# Patient Record
Sex: Male | Born: 1960 | Race: White | Hispanic: No | Marital: Married | Smoking: Former smoker
Health system: Southern US, Community
[De-identification: ages and names within clinical notes are randomized; demographics above are authoritative.]

## PROBLEM LIST (undated history)

## (undated) DIAGNOSIS — E7800 Pure hypercholesterolemia, unspecified: Secondary | ICD-10-CM

## (undated) DIAGNOSIS — I10 Essential (primary) hypertension: Secondary | ICD-10-CM

## (undated) DIAGNOSIS — E785 Hyperlipidemia, unspecified: Secondary | ICD-10-CM

## (undated) DIAGNOSIS — E119 Type 2 diabetes mellitus without complications: Secondary | ICD-10-CM

## (undated) HISTORY — DX: Type 2 diabetes mellitus without complications: E11.9

## (undated) HISTORY — DX: Pure hypercholesterolemia, unspecified: E78.00

## (undated) HISTORY — DX: Hyperlipidemia, unspecified: E78.5

## (undated) HISTORY — DX: Essential (primary) hypertension: I10

---

## 2008-08-22 HISTORY — PX: COLONOSCOPY: SHX174

## 2016-09-16 DIAGNOSIS — J0100 Acute maxillary sinusitis, unspecified: Secondary | ICD-10-CM | POA: Diagnosis not present

## 2016-11-07 DIAGNOSIS — E785 Hyperlipidemia, unspecified: Secondary | ICD-10-CM | POA: Diagnosis not present

## 2016-11-07 DIAGNOSIS — E119 Type 2 diabetes mellitus without complications: Secondary | ICD-10-CM | POA: Diagnosis not present

## 2017-02-07 DIAGNOSIS — I10 Essential (primary) hypertension: Secondary | ICD-10-CM | POA: Diagnosis not present

## 2017-02-07 DIAGNOSIS — E785 Hyperlipidemia, unspecified: Secondary | ICD-10-CM | POA: Diagnosis not present

## 2017-02-07 DIAGNOSIS — E119 Type 2 diabetes mellitus without complications: Secondary | ICD-10-CM | POA: Diagnosis not present

## 2017-05-16 DIAGNOSIS — I10 Essential (primary) hypertension: Secondary | ICD-10-CM | POA: Diagnosis not present

## 2017-05-16 DIAGNOSIS — E1169 Type 2 diabetes mellitus with other specified complication: Secondary | ICD-10-CM | POA: Diagnosis not present

## 2017-05-16 DIAGNOSIS — E782 Mixed hyperlipidemia: Secondary | ICD-10-CM | POA: Diagnosis not present

## 2017-08-28 DIAGNOSIS — Z1211 Encounter for screening for malignant neoplasm of colon: Secondary | ICD-10-CM | POA: Diagnosis not present

## 2017-08-28 DIAGNOSIS — Z0000 Encounter for general adult medical examination without abnormal findings: Secondary | ICD-10-CM | POA: Diagnosis not present

## 2017-08-28 DIAGNOSIS — Z125 Encounter for screening for malignant neoplasm of prostate: Secondary | ICD-10-CM | POA: Diagnosis not present

## 2017-12-26 DIAGNOSIS — E785 Hyperlipidemia, unspecified: Secondary | ICD-10-CM | POA: Diagnosis not present

## 2017-12-26 DIAGNOSIS — I10 Essential (primary) hypertension: Secondary | ICD-10-CM | POA: Diagnosis not present

## 2017-12-26 DIAGNOSIS — E119 Type 2 diabetes mellitus without complications: Secondary | ICD-10-CM | POA: Diagnosis not present

## 2017-12-29 DIAGNOSIS — I10 Essential (primary) hypertension: Secondary | ICD-10-CM | POA: Diagnosis not present

## 2017-12-29 DIAGNOSIS — E119 Type 2 diabetes mellitus without complications: Secondary | ICD-10-CM | POA: Diagnosis not present

## 2017-12-29 DIAGNOSIS — E785 Hyperlipidemia, unspecified: Secondary | ICD-10-CM | POA: Diagnosis not present

## 2018-04-13 DIAGNOSIS — E785 Hyperlipidemia, unspecified: Secondary | ICD-10-CM | POA: Diagnosis not present

## 2018-04-13 DIAGNOSIS — I10 Essential (primary) hypertension: Secondary | ICD-10-CM | POA: Diagnosis not present

## 2018-04-13 DIAGNOSIS — E119 Type 2 diabetes mellitus without complications: Secondary | ICD-10-CM | POA: Diagnosis not present

## 2018-04-16 DIAGNOSIS — E785 Hyperlipidemia, unspecified: Secondary | ICD-10-CM | POA: Diagnosis not present

## 2018-04-16 DIAGNOSIS — E119 Type 2 diabetes mellitus without complications: Secondary | ICD-10-CM | POA: Diagnosis not present

## 2018-04-16 DIAGNOSIS — I10 Essential (primary) hypertension: Secondary | ICD-10-CM | POA: Diagnosis not present

## 2018-07-18 DIAGNOSIS — I10 Essential (primary) hypertension: Secondary | ICD-10-CM | POA: Diagnosis not present

## 2018-07-18 DIAGNOSIS — E119 Type 2 diabetes mellitus without complications: Secondary | ICD-10-CM | POA: Diagnosis not present

## 2018-07-18 DIAGNOSIS — E785 Hyperlipidemia, unspecified: Secondary | ICD-10-CM | POA: Diagnosis not present

## 2018-07-23 DIAGNOSIS — M79671 Pain in right foot: Secondary | ICD-10-CM | POA: Diagnosis not present

## 2018-07-23 DIAGNOSIS — E1165 Type 2 diabetes mellitus with hyperglycemia: Secondary | ICD-10-CM | POA: Diagnosis not present

## 2018-07-23 DIAGNOSIS — M79661 Pain in right lower leg: Secondary | ICD-10-CM | POA: Diagnosis not present

## 2018-07-31 DIAGNOSIS — R2241 Localized swelling, mass and lump, right lower limb: Secondary | ICD-10-CM | POA: Diagnosis not present

## 2018-07-31 DIAGNOSIS — M79671 Pain in right foot: Secondary | ICD-10-CM | POA: Diagnosis not present

## 2018-07-31 DIAGNOSIS — M79661 Pain in right lower leg: Secondary | ICD-10-CM | POA: Diagnosis not present

## 2018-07-31 DIAGNOSIS — M19071 Primary osteoarthritis, right ankle and foot: Secondary | ICD-10-CM | POA: Diagnosis not present

## 2018-10-22 DIAGNOSIS — I10 Essential (primary) hypertension: Secondary | ICD-10-CM | POA: Diagnosis not present

## 2018-10-22 DIAGNOSIS — E785 Hyperlipidemia, unspecified: Secondary | ICD-10-CM | POA: Diagnosis not present

## 2018-10-22 DIAGNOSIS — E119 Type 2 diabetes mellitus without complications: Secondary | ICD-10-CM | POA: Diagnosis not present

## 2020-01-08 ENCOUNTER — Telehealth: Payer: Self-pay | Admitting: Cardiology

## 2020-01-08 ENCOUNTER — Other Ambulatory Visit: Payer: Self-pay

## 2020-01-08 NOTE — Telephone Encounter (Signed)
New Message    Pts wife is returning call to [facility]    Please call

## 2020-01-09 ENCOUNTER — Encounter: Payer: Self-pay | Admitting: Cardiology

## 2020-01-09 ENCOUNTER — Ambulatory Visit: Payer: Commercial Managed Care - PPO | Admitting: Cardiology

## 2020-01-09 ENCOUNTER — Other Ambulatory Visit: Payer: Self-pay

## 2020-01-09 DIAGNOSIS — E088 Diabetes mellitus due to underlying condition with unspecified complications: Secondary | ICD-10-CM | POA: Insufficient documentation

## 2020-01-09 DIAGNOSIS — I4729 Other ventricular tachycardia: Secondary | ICD-10-CM

## 2020-01-09 DIAGNOSIS — I10 Essential (primary) hypertension: Secondary | ICD-10-CM

## 2020-01-09 DIAGNOSIS — E782 Mixed hyperlipidemia: Secondary | ICD-10-CM

## 2020-01-09 DIAGNOSIS — I472 Ventricular tachycardia: Secondary | ICD-10-CM | POA: Diagnosis not present

## 2020-01-09 DIAGNOSIS — F17210 Nicotine dependence, cigarettes, uncomplicated: Secondary | ICD-10-CM

## 2020-01-09 HISTORY — DX: Other ventricular tachycardia: I47.29

## 2020-01-09 HISTORY — DX: Ventricular tachycardia: I47.2

## 2020-01-09 HISTORY — DX: Diabetes mellitus due to underlying condition with unspecified complications: E08.8

## 2020-01-09 HISTORY — DX: Nicotine dependence, cigarettes, uncomplicated: F17.210

## 2020-01-09 HISTORY — DX: Essential (primary) hypertension: I10

## 2020-01-09 HISTORY — DX: Mixed hyperlipidemia: E78.2

## 2020-01-09 MED ORDER — METOPROLOL SUCCINATE ER 50 MG PO TB24
50.0000 mg | ORAL_TABLET | Freq: Every day | ORAL | 0 refills | Status: DC
Start: 2020-01-09 — End: 2020-09-22

## 2020-01-09 MED ORDER — METOPROLOL SUCCINATE ER 50 MG PO TB24
50.0000 mg | ORAL_TABLET | Freq: Every day | ORAL | 3 refills | Status: DC
Start: 2020-01-09 — End: 2020-09-22

## 2020-01-09 NOTE — Patient Instructions (Signed)
Medication Instructions:  Your physician has recommended you make the following change in your medication:   Start Toprol XL 50 mg daily.  *If you need a refill on your cardiac medications before your next appointment, please call your pharmacy*   Lab Work: You had a BMET today in the office.  If you have labs (blood work) drawn today and your tests are completely normal, you will receive your results only by: Marland Kitchen MyChart Message (if you have MyChart) OR . A paper copy in the mail If you have any lab test that is abnormal or we need to change your treatment, we will call you to review the results.   Testing/Procedures: Your physician has requested that you have an echocardiogram. Echocardiography is a painless test that uses sound waves to create images of your heart. It provides your doctor with information about the size and shape of your heart and how well your heart's chambers and valves are working. This procedure takes approximately one hour. There are no restrictions for this procedure.    Your cardiac CT will be scheduled at:   [facility] [address] [place], [place] [address] [phone]   [facility], please arrive at the [facility] main entrance of [facility] 30 minutes prior to test start time. Proceed to the [facility] Radiology Department (first floor) to check-in and test prep.   Please follow these instructions carefully (unless otherwise directed):  Hold all erectile dysfunction medications at least 3 days (72 hrs) prior to test.  On the Night Before the Test: . Be sure to Drink plenty of water. . Do not consume any caffeinated/decaffeinated beverages or chocolate 12 hours prior to your test. . Do not take any antihistamines 12 hours prior to your test. . If you take Metformin do not take 24 hours prior to test.  On the Day of the Test: . Drink plenty of water. Do not drink any water within one hour of the test. . Do  not eat any food 4 hours prior to the test. . You may take your regular medications prior to the test.  . Take metoprolol (Lopressor) two hours prior to test.       After the Test: . Drink plenty of water. . After receiving IV contrast, you may experience a mild flushed feeling. This is normal. . On occasion, you may experience a mild rash up to 24 hours after the test. This is not dangerous. If this occurs, you can take Benadryl 25 mg and increase your fluid intake. . If you experience trouble breathing, this can be serious. If it is severe call 911 IMMEDIATELY. If it is mild, please call our office. . If you take any of these medications: Glipizide/Metformin, Avandament, Glucavance, please do not take 48 hours after completing test unless otherwise instructed.   Once we have confirmed authorization from your insurance company, we will call you to set up a date and time for your test.   For non-scheduling related questions, please contact the cardiac imaging nurse navigator should you have any questions/concerns: Marchia Bond, Cardiac Imaging Nurse Navigator Burley Saver, Interim Cardiac Imaging Nurse [facility] Direct Office Dial: [phone]   For scheduling needs, including cancellations and rescheduling, please call [phone].      Follow-Up: At [facility], you and your health needs are our priority.  As part of our continuing mission to provide you with exceptional heart care, we have created  designated Provider Care Teams.  These Care Teams include your primary Cardiologist (physician) and Advanced Practice Providers (APPs -  Physician Assistants and Nurse Practitioners) who all work together to provide you with the care you need, when you need it.  We recommend signing up for the patient portal called "MyChart".  Sign up information is provided on this After Visit Summary.  MyChart is used to connect with patients for Virtual Visits  (Telemedicine).  Patients are able to view lab/test results, encounter notes, upcoming appointments, etc.  Non-urgent messages can be sent to your provider as well.   To learn more about what you can do with MyChart, go to [email or web address].    Your next appointment:   6 month(s)  The format for your next appointment:   In Person  Provider:   Jyl Heinz, MD   Other Instructions  Echocardiogram An echocardiogram is a procedure that uses painless sound waves (ultrasound) to produce an image of the heart. Images from an echocardiogram can provide important information about:  Signs of coronary artery disease (CAD).  Aneurysm detection. An aneurysm is a weak or damaged part of an artery wall that bulges out from the normal force of blood pumping through the body.  Heart size and shape. Changes in the size or shape of the heart can be associated with certain conditions, including heart failure, aneurysm, and CAD.  Heart muscle function.  Heart valve function.  Signs of a past heart attack.  Fluid buildup around the heart.  Thickening of the heart muscle.  A tumor or infectious growth around the heart valves. Tell a health care provider about:  Any allergies you have.  All medicines you are taking, including vitamins, herbs, eye drops, creams, and over-the-counter medicines.  Any blood disorders you have.  Any surgeries you have had.  Any medical conditions you have.  Whether you are pregnant or may be pregnant. What are the risks? Generally, this is a safe procedure. However, problems may occur, including:  Allergic reaction to dye (contrast) that may be used during the procedure. What happens before the procedure? No specific preparation is needed. You may eat and drink normally. What happens during the procedure?   An IV tube may be inserted into one of your veins.  You may receive contrast through this tube. A contrast is an injection that improves  the quality of the pictures from your heart.  A gel will be applied to your chest.  A wand-like tool (transducer) will be moved over your chest. The gel will help to transmit the sound waves from the transducer.  The sound waves will harmlessly bounce off of your heart to allow the heart images to be captured in real-time motion. The images will be recorded on a computer. The procedure may vary among health care providers and hospitals. What happens after the procedure?  You may return to your normal, everyday life, including diet, activities, and medicines, unless your health care provider tells you not to do that. Summary  An echocardiogram is a procedure that uses painless sound waves (ultrasound) to produce an image of the heart.  Images from an echocardiogram can provide important information about the size and shape of your heart, heart muscle function, heart valve function, and fluid buildup around your heart.  You do not need to do anything to prepare before this procedure. You may eat and drink normally.  After the echocardiogram is completed, you may return to your normal, everyday life, unless your health  care provider tells you not to do that. This information is not intended to replace advice given to you by your health care provider. Make sure you discuss any questions you have with your health care provider. Document Revised: 11/29/2018 Document Reviewed: 09/10/2016 Elsevier Patient Education  [place].

## 2020-01-09 NOTE — Addendum Note (Signed)
Addended by: Eleonore Chiquito on: 01/09/2020 03:39 PM   Modules accepted: Orders

## 2020-01-09 NOTE — Progress Notes (Signed)
Cardiology Office Note:    Date:  01/09/2020   ID:  Brian Salas, DOB November 06, 1960, MRN [id number]  PCP:  Brian Severin, NP  Cardiologist:  Brian Brothers, MD   Referring MD: Brian Severin, NP    ASSESSMENT:    1. Nonsustained ventricular tachycardia (HCC)   2. Essential hypertension   3. Cigarette smoker   4. Diabetes mellitus due to underlying condition with unspecified complications (HCC)   5. Mixed dyslipidemia    PLAN:    In order of problems listed above:  1. Coronary artery disease: Coronary calcification was seen on the CT scan.  Secondary prevention stressed with him.  Importance of compliance with diet medication stressed and he vocalized understanding.  In view of nonsustained ventricular tachycardia we will do a CT scan with FFR.  He is agreeable for this.  I discussed coronary angiography and left heart catheterization and he is not keen on it he would prefer to go with the noninvasive route at this time and I respect his wishes. 2. Nonsustained ventricular tachycardia: As mentioned above.  I discussed diagnosis with him at extensive length.  Lab work is fine.  He will be initiated on beta-blocker Toprol-XL 50 mg daily beginning today. 3. Diabetes mellitus and mixed dyslipidemia: Diet was emphasized.  This is followed by his primary care physician. 4. Cigarette smoker: He quit a few months ago and I discussed smoking cessation with him at extensive length and he promises to never go back to smoking. 5. Patient will be seen in follow-up appointment in 1 month or earlier if the patient has any concerns.  He knows to go to the nearest emergency room for any concerning symptoms.    Medication Adjustments/Labs and Tests Ordered: Current medicines are reviewed at length with the patient today.  Concerns regarding medicines are outlined above.  No orders of the defined types were placed in this encounter.  No orders of the defined types were placed in this  encounter.    History of Present Illness:    Brian Salas is a 59 y.o. male who is being seen today for the evaluation of nonsustained ventricular tachycardia at the request of York, Brian F, NP.  Patient has past medical history of essential hypertension dyslipidemia diabetes mellitus and cigarette smoking.  He mentions to me that he takes care of activities of daily living.  He occasionally had a fluttering sensation in his heart many months ago and monitoring revealed nonsustained ventricular tachycardia and have a copy of that report.  He has never had dizzy spells or syncope.  At the time of my evaluation is alert awake oriented and in no distress.  He tells me he works full-time at [employer] and has no issues of chest pain or any such symptoms with this activity.  Past Medical History:  Diagnosis Date  . Diabetes (HCC)   . High cholesterol   . Hyperlipidemia   . Hypertension     Past Surgical History:  Procedure Laterality Date  . COLONOSCOPY  2010    Current Medications: Current Meds  Medication Sig  . aspirin EC 81 MG tablet Take 81 mg by mouth daily.  Marland Kitchen atorvastatin (LIPITOR) 20 MG tablet Take 20 mg by mouth daily.  Marland Kitchen lisinopril-hydrochlorothiazide (ZESTORETIC) 20-12.5 MG tablet Take 1 tablet by mouth daily.  . metFORMIN (GLUCOPHAGE) 1000 MG tablet Take 1,000 mg by mouth daily.     Allergies:   Morphine and related and Prednisone   Social History  Socioeconomic History  . Marital status: Married    Spouse name: Not on file  . Number of children: Not on file  . Years of education: Not on file  . Highest education level: Not on file  Occupational History  . Not on file  Tobacco Use  . Smoking status: Former Smoker    Types: Cigarettes  . Smokeless tobacco: Never Used  Substance and Sexual Activity  . Alcohol use: Not on file  . Drug use: Not on file  . Sexual activity: Not on file  Other Topics Concern  . Not on file  Social History Narrative  .  Not on file   Social Determinants of Health   Financial Resource Strain:   . Difficulty of Paying Living Expenses:   Food Insecurity:   . Worried About Charity fundraiser in the Last Year:   . Arboriculturist in the Last Year:   Transportation Needs:   . Film/video editor (Medical):   Marland Kitchen Lack of Transportation (Non-Medical):   Physical Activity:   . Days of Exercise per Week:   . Minutes of Exercise per Session:   Stress:   . Feeling of Stress :   Social Connections:   . Frequency of Communication with Friends and Family:   . Frequency of Social Gatherings with Friends and Family:   . Attends Religious Services:   . Active Member of Clubs or Organizations:   . Attends Archivist Meetings:   Marland Kitchen Marital Status:      Family History: The patient's family history includes Diabetes in his father and mother; Heart disease in his brother and father; High Cholesterol in his father; Hypertension in his father and mother.  ROS:   Please see the history of present illness.    All other systems reviewed and are negative.  EKGs/Labs/Other Studies Reviewed:    The following studies were reviewed today: EKG reveals sinus rhythm nonspecific ST-T changes.  Event monitoring has revealed nonsustained ventricular tachycardia.   Recent Labs: No results found for requested labs within last 8760 hours.  Recent Lipid Panel No results found for: CHOL, TRIG, HDL, CHOLHDL, VLDL, LDLCALC, LDLDIRECT  Physical Exam:    VS:  BP 138/80   Pulse 96   Ht 6' (1.829 m)   Wt 203 lb (92.1 kg)   SpO2 92%   BMI 27.53 kg/m     Wt Readings from Last 3 Encounters:  01/09/20 203 lb (92.1 kg)     GEN: Patient is in no acute distress HEENT: Normal NECK: No JVD; No carotid bruits LYMPHATICS: No lymphadenopathy CARDIAC: S1 S2 regular, 2/6 systolic murmur at the apex. RESPIRATORY:  Clear to auscultation without rales, wheezing or rhonchi  ABDOMEN: Soft, non-tender,  non-distended MUSCULOSKELETAL:  No edema; No deformity  SKIN: Warm and dry NEUROLOGIC:  Alert and oriented x 3 PSYCHIATRIC:  Normal affect    Signed, Jenean Lindau, MD  01/09/2020 3:07 PM    [facility]

## 2020-01-10 ENCOUNTER — Telehealth: Payer: Self-pay

## 2020-01-10 ENCOUNTER — Telehealth (HOSPITAL_COMMUNITY): Payer: Self-pay | Admitting: *Deleted

## 2020-01-10 LAB — BASIC METABOLIC PANEL
BUN/Creatinine Ratio: 12 (ref 9–20)
BUN: 14 mg/dL (ref 6–24)
CO2: 21 mmol/L (ref 20–29)
Calcium: 10.1 mg/dL (ref 8.7–10.2)
Chloride: 102 mmol/L (ref 96–106)
Creatinine, Ser: 1.15 mg/dL (ref 0.76–1.27)
GFR calc Af Amer: 80 mL/min/{1.73_m2} (ref >59)
GFR calc non Af Amer: 69 mL/min/{1.73_m2} (ref >59)
Glucose: 121 mg/dL — ABNORMAL HIGH (ref 65–99)
Potassium: 4.1 mmol/L (ref 3.5–5.2)
Sodium: 140 mmol/L (ref 134–144)

## 2020-01-10 NOTE — Telephone Encounter (Signed)
-----   Message from Rajan R Revankar, MD sent at 01/10/2020  7:52 AM EDT ----- The results of the study is unremarkable. Please inform patient. I will discuss in detail at next appointment. Cc  primary care/referring physician Rajan R Revankar, MD 01/10/2020 7:52 AM  

## 2020-01-10 NOTE — Telephone Encounter (Signed)
Spoke with patients wife regarding results and recommendation. ° °She verbalizes understanding and is agreeable to plan of care. Advised patient to call back with any issues or concerns.  °

## 2020-01-10 NOTE — Telephone Encounter (Signed)
Reaching out to patient to offer assistance regarding upcoming cardiac imaging study; pt verbalizes understanding of appt date/time, parking situation and where to check in, pre-test NPO status and medications ordered, and verified current allergies; name and call back number provided for further questions should they arise  Roxsana Riding Tai RN Navigator Cardiac Imaging [facility] [phone] office [phone] cell 

## 2020-01-13 ENCOUNTER — Ambulatory Visit (HOSPITAL_COMMUNITY)
Admission: RE | Admit: 2020-01-13 | Discharge: 2020-01-13 | Disposition: A | Discharge status: Home / Self Care | Payer: Commercial Managed Care - PPO | Source: Ambulatory Visit | Attending: Cardiology | Admitting: Cardiology

## 2020-01-13 ENCOUNTER — Other Ambulatory Visit: Payer: Self-pay

## 2020-01-13 DIAGNOSIS — I472 Ventricular tachycardia: Secondary | ICD-10-CM | POA: Insufficient documentation

## 2020-01-13 DIAGNOSIS — I2510 Atherosclerotic heart disease of native coronary artery without angina pectoris: Secondary | ICD-10-CM | POA: Diagnosis not present

## 2020-01-13 IMAGING — CT CT HEART MORP W/ CTA COR W/ SCORE W/ CA W/CM &/OR W/O CM
4 of 7 series · 8 of 20 positions shown, 9 images · non-contrast
Comparison: None.
COMPARISON: None.

Addendum:
EXAM:
OVER-READ INTERPRETATION  CT CHEST

The following report is an over-read performed by radiologist Dr.
Zoran Murphy [REDACTED] on 01/13/2020. This
over-read does not include interpretation of cardiac or coronary
anatomy or pathology. The coronary calcium score/coronary CTA
interpretation by the cardiologist is attached.
CLINICAL DATA: Non-sustained VT
Cardiac/Coronary CTA
TECHNIQUE: The patient was scanned on a Phillips Force scanner. A 100 kV
prospective scan was triggered in the descending thoracic aorta at
111 HU's. Axial non-contrast 3 mm slices were carried out through
the heart. The data set was analyzed on a dedicated work station and
scored using the Agatson method. Gantry rotation speed was 250 msecs
and collimation was .6 mm. No beta blockade and 0.8 mg of sl NTG was
given. The 3D data set was reconstructed in 5% intervals of the
35-75 % of the R-R cycle. Diastolic phases were analyzed on a
dedicated work station using MPR, MIP and VRT modes. The patient
received 80 cc of contrast.

[Series 6: best diast 69 % · axial · 0.39mm/px · z∈[-222,-187]mm · 2 of 263 slices shown]
[im 88/263  vessel]
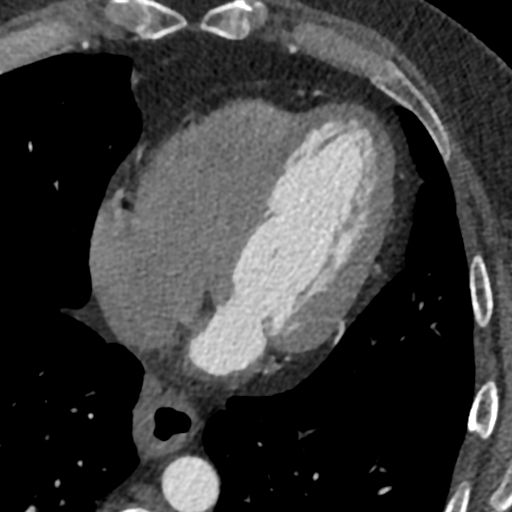
[im 175/263  vessel]
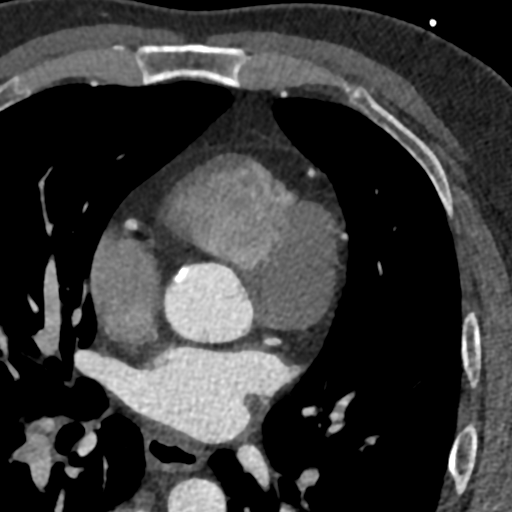

[Series 7: best syst · axial · 0.39mm/px · z∈[-222,-187]mm · 2 of 263 slices shown, 3 images]
[im 88/263  vessel]
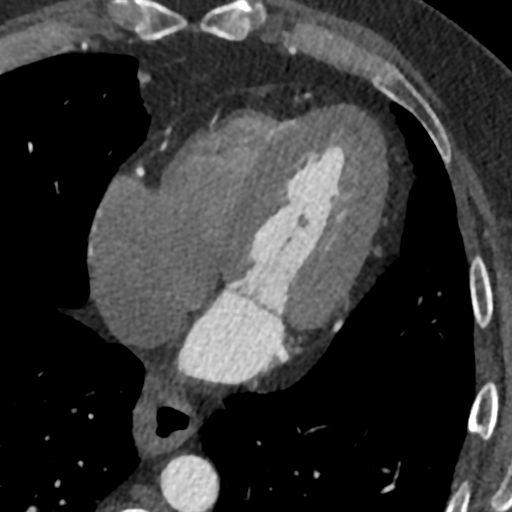
[im 88/263  lung]
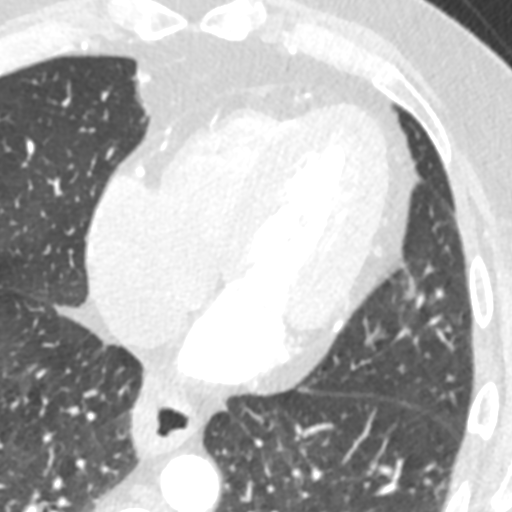
[im 175/263  vessel]
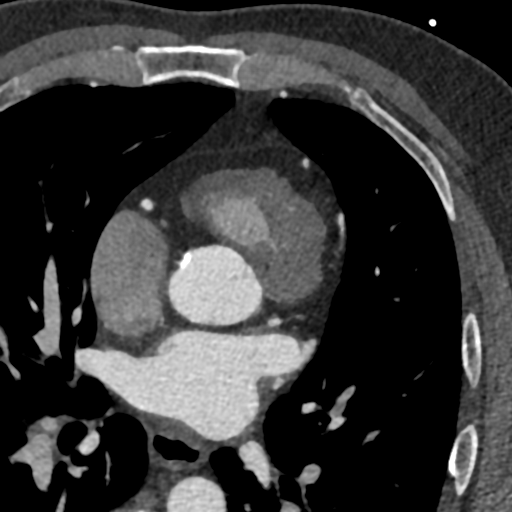

[Series 8: ts diast sharp 69 % · axial · 0.39mm/px · z∈[-222,-187]mm · 2 of 263 slices shown]
[im 88/263  lung]
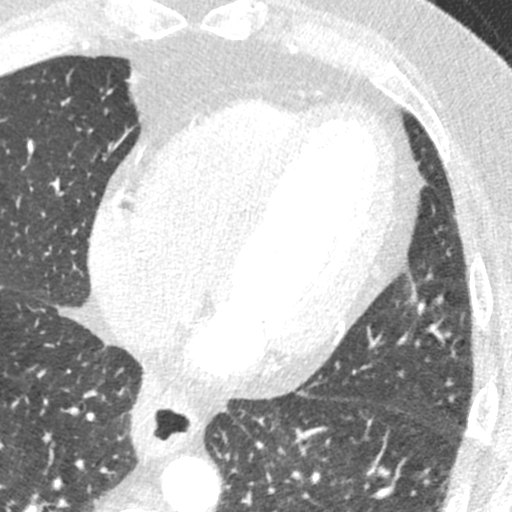
[im 175/263  lung]
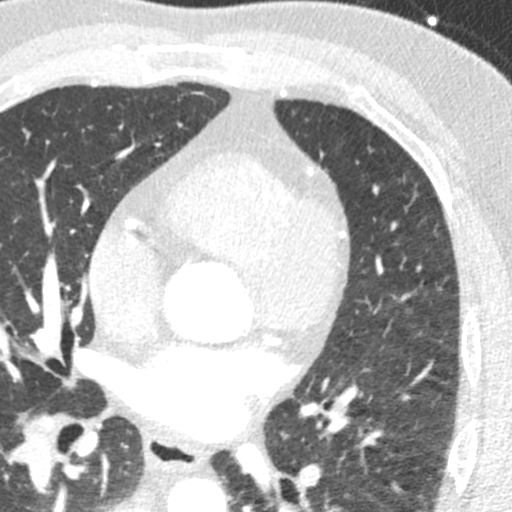

[Series 9: ts syst sharp · axial · 0.39mm/px · z∈[-222,-187]mm · 2 of 263 slices shown]
[im 88/263  lung]
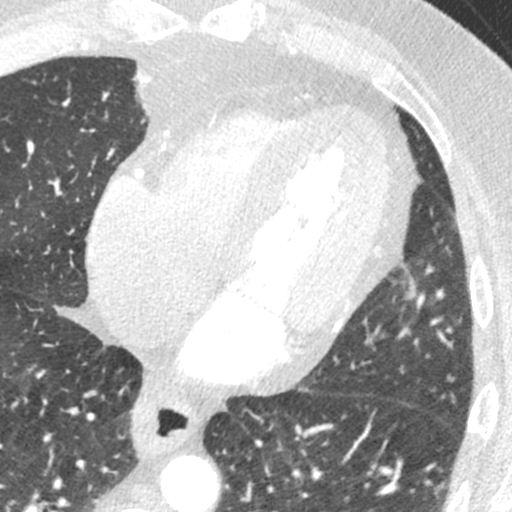
[im 175/263  lung]
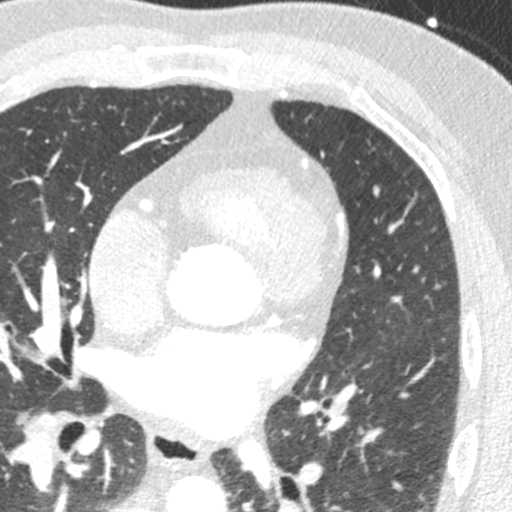

[8 of 20 positions shown; findings below may reference images not displayed]

FINDINGS: Aortic atherosclerosis. Small hiatal hernia. Within the visualized
portions of the thorax there are no suspicious appearing pulmonary
nodules or masses, there is no acute consolidative airspace disease,
no pleural effusions, no pneumothorax and no lymphadenopathy.
Visualized portions of the upper abdomen are unremarkable. There are
no aggressive appearing lytic or blastic lesions noted in the
visualized portions of the skeleton.
IMPRESSION: 1.  Aortic Atherosclerosis (A3O1N-TIY.Y).
2. Small hiatal hernia.
FINDINGS: Image quality: excellent.

Noise artifact is: Limited.

Coronary Arteries:  Normal coronary origin.  Right dominance.

Left main: The left main is a large caliber vessel with a normal
take off from the left coronary cusp that bifurcates to form a left
anterior descending artery and a left circumflex artery. There is
minimal calcified plaque (<25%).

Left anterior descending artery: The proximal LAD contains at least
moderate calcified plaque (50-69%). There is heavy circumferential
calcified plaque making lesion assessment difficult. The mid and
distal LAD segments are patent. The first diagonal branch is large
with minimal calcified plaque (<25%).

Left circumflex artery: The LCX is non-dominant. The proximal LCX
contains minimal calcified plaque (<25%). The mid and distal LCX
segments are patent. The LCX gives off 1 patent obtuse marginal
branch.

Right coronary artery: The RCA is dominant with normal take off from
the right coronary cusp. The ostial RCA contains minimal calcified
plaque (<25%). The mid RCA contains minimal calcified plaque (<25%).
The RCA terminates as a patent PDA and right posterolateral branch
without evidence of plaque or stenosis.

Right Atrium: Right atrial size is within normal limits.

Right Ventricle: The right ventricular cavity is within normal
limits.

Left Atrium: Left atrial size is normal in size with no left atrial
appendage filling defect. A PFO is present.

Left Ventricle: The ventricular cavity size is within normal limits.
There are no stigmata of prior infarction. There is no abnormal
filling defect.

Pulmonary arteries: Normal in size without proximal filling defect.

Pulmonary veins: Normal pulmonary venous drainage.

Pericardium: Normal thickness with no significant effusion or
calcium present.

Cardiac valves: The aortic valve is trileaflet without significant
calcification. The mitral valve is normal structure without
significant calcification.

Aorta: Normal caliber with no significant disease. Mild aortic root
calcifications.

Extra-cardiac findings: See attached radiology report for
non-cardiac structures.
IMPRESSION: 1. Coronary calcium score of 818. This was 96th percentile for age
and sex matched control.

2. Normal coronary origin with right dominance.

3. Concerns for moderate calcified plaque (50-69%) in the proximal
LAD. Heavy blooming artifact noted.

4. Minimal calcified plaque (<25%) in the LCX/RCA.

5. A small PFO is present.

RECOMMENDATIONS:
1. Moderate stenosis (50-69%) in the proximal LAD. Consider
symptom-guided anti-ischemic pharmacotherapy as well as risk factor
modification per guideline directed care. Additional analysis with
CT FFR will be submitted.

*** End of Addendum ***
EXAM:
OVER-READ INTERPRETATION  CT CHEST

The following report is an over-read performed by radiologist Dr.
Zoran Murphy [REDACTED] on 01/13/2020. This
over-read does not include interpretation of cardiac or coronary
anatomy or pathology. The coronary calcium score/coronary CTA
interpretation by the cardiologist is attached.
FINDINGS: Aortic atherosclerosis. Small hiatal hernia. Within the visualized
portions of the thorax there are no suspicious appearing pulmonary
nodules or masses, there is no acute consolidative airspace disease,
no pleural effusions, no pneumothorax and no lymphadenopathy.
Visualized portions of the upper abdomen are unremarkable. There are
no aggressive appearing lytic or blastic lesions noted in the
visualized portions of the skeleton.
IMPRESSION: 1.  Aortic Atherosclerosis (A3O1N-TIY.Y).
2. Small hiatal hernia.

## 2020-01-13 MED ORDER — NITROGLYCERIN 0.4 MG SL SUBL
SUBLINGUAL_TABLET | SUBLINGUAL | Status: AC
  Administered 2020-01-13: 0.8 mg via SUBLINGUAL
  Filled 2020-01-13: qty 2

## 2020-01-13 MED ORDER — IOHEXOL 350 MG/ML SOLN
100.0000 mL | Freq: Once | INTRAVENOUS | Status: AC | PRN
  Administered 2020-01-13: 80 mL via INTRAVENOUS

## 2020-01-13 MED ORDER — NITROGLYCERIN 0.4 MG SL SUBL: 0.8000 mg | SUBLINGUAL_TABLET | Freq: Once | SUBLINGUAL | Status: AC

## 2020-01-14 DIAGNOSIS — I472 Ventricular tachycardia: Secondary | ICD-10-CM | POA: Diagnosis not present

## 2020-01-14 DIAGNOSIS — I2510 Atherosclerotic heart disease of native coronary artery without angina pectoris: Secondary | ICD-10-CM | POA: Diagnosis not present

## 2020-01-15 ENCOUNTER — Telehealth: Payer: Self-pay | Admitting: Cardiology

## 2020-01-15 NOTE — Telephone Encounter (Signed)
Connected call to [facility] in reference to CT results

## 2020-01-16 ENCOUNTER — Other Ambulatory Visit: Payer: Self-pay

## 2020-01-16 ENCOUNTER — Ambulatory Visit (INDEPENDENT_AMBULATORY_CARE_PROVIDER_SITE_OTHER): Payer: Commercial Managed Care - PPO | Admitting: Cardiology

## 2020-01-16 ENCOUNTER — Encounter: Payer: Self-pay | Admitting: Cardiology

## 2020-01-16 VITALS — BP 138/76 | HR 82 | Ht 72.0 in | Wt 204.0 lb

## 2020-01-16 DIAGNOSIS — F17210 Nicotine dependence, cigarettes, uncomplicated: Secondary | ICD-10-CM | POA: Diagnosis not present

## 2020-01-16 DIAGNOSIS — E782 Mixed hyperlipidemia: Secondary | ICD-10-CM

## 2020-01-16 DIAGNOSIS — I472 Ventricular tachycardia: Secondary | ICD-10-CM

## 2020-01-16 DIAGNOSIS — I10 Essential (primary) hypertension: Secondary | ICD-10-CM

## 2020-01-16 DIAGNOSIS — I4729 Other ventricular tachycardia: Secondary | ICD-10-CM

## 2020-01-16 DIAGNOSIS — I2510 Atherosclerotic heart disease of native coronary artery without angina pectoris: Secondary | ICD-10-CM | POA: Insufficient documentation

## 2020-01-16 DIAGNOSIS — E088 Diabetes mellitus due to underlying condition with unspecified complications: Secondary | ICD-10-CM

## 2020-01-16 HISTORY — DX: Atherosclerotic heart disease of native coronary artery without angina pectoris: I25.10

## 2020-01-16 NOTE — Progress Notes (Signed)
Cardiology Office Note:    Date:  01/16/2020   ID:  Brian Salas, DOB 03-Dec-1960, MRN [id number]  PCP:  Brian Riches, NP  Cardiologist:  Brian Lindau, MD   Referring MD: Brian Riches, NP    ASSESSMENT:    1. Essential hypertension   2. Coronary artery disease involving native coronary artery of native heart without angina pectoris   3. Nonsustained ventricular tachycardia ([place])   4. Cigarette smoker   5. Diabetes mellitus due to underlying condition with unspecified complications ([place])   6. Mixed dyslipidemia    PLAN:    In order of problems listed above:  1. Coronary artery disease: Secondary prevention stressed with the patient.  Importance of compliance with diet and medication stressed he vocalized understanding.  CT coronary angiography reports were discussed with the patient at extensive length and questions were answered to his satisfaction.  He takes a coated aspirin on a daily basis. 2. Essential hypertension: Blood pressure stable 3. Mixed dyslipidemia: I will get him back for the next few days for blood work and probably change his statin to a medication such as atorvastatin or rosuvastatin. 4. I counseled him about smoking again and he plans to put 5. Patient will be seen in follow-up appointment in 6 months or earlier if the patient has any concerns 6.    Medication Adjustments/Labs and Tests Ordered: Current medicines are reviewed at length with the patient today.  Concerns regarding medicines are outlined above.  No orders of the defined types were placed in this encounter.  No orders of the defined types were placed in this encounter.    Chief Complaint  Patient presents with  . Follow-up     History of Present Illness:    Brian Salas is a 59 y.o. male.  Patient has past medical history of essential hypertension dyslipidemia and is a smoker.  CT coronary angiography revealed significant calcium score and significant amount of nonobstructive  coronary artery disease.  Therefore he was called for evaluation.  He denies any chest pain orthopnea or PND.  He works actively a full-time job.  At the time of my evaluation, the patient is alert awake oriented and in no distress.  Past Medical History:  Diagnosis Date  . Diabetes ([place])   . High cholesterol   . Hyperlipidemia   . Hypertension     Past Surgical History:  Procedure Laterality Date  . COLONOSCOPY  2010    Current Medications: Current Meds  Medication Sig  . aspirin EC 81 MG tablet Take 81 mg by mouth daily.  Marland Kitchen atorvastatin (LIPITOR) 20 MG tablet Take 20 mg by mouth daily.  Marland Kitchen lisinopril-hydrochlorothiazide (ZESTORETIC) 20-12.5 MG tablet Take 1 tablet by mouth daily.  . metFORMIN (GLUCOPHAGE) 1000 MG tablet Take 1,000 mg by mouth daily.  . metoprolol succinate (TOPROL-XL) 50 MG 24 hr tablet Take 1 tablet (50 mg total) by mouth daily. Take with or immediately following a meal.  . metoprolol succinate (TOPROL-XL) 50 MG 24 hr tablet Take 1 tablet (50 mg total) by mouth daily. Take with or immediately following a meal.     Allergies:   Morphine and related and Prednisone   Social History   Socioeconomic History  . Marital status: Married    Spouse name: Not on file  . Number of children: Not on file  . Years of education: Not on file  . Highest education level: Not on file  Occupational History  . Not on file  Tobacco Use  . Smoking status: Former Smoker    Types: Cigarettes  . Smokeless tobacco: Never Used  Substance and Sexual Activity  . Alcohol use: Not on file  . Drug use: Not on file  . Sexual activity: Not on file  Other Topics Concern  . Not on file  Social History Narrative  . Not on file   Social Determinants of Health   Financial Resource Strain:   . Difficulty of Paying Living Expenses:   Food Insecurity:   . Worried About Programme researcher, broadcasting/film/video in the Last Year:   . Barista in the Last Year:   Transportation Needs:   . Automotive engineer (Medical):   Marland Kitchen Lack of Transportation (Non-Medical):   Physical Activity:   . Days of Exercise per Week:   . Minutes of Exercise per Session:   Stress:   . Feeling of Stress :   Social Connections:   . Frequency of Communication with Friends and Family:   . Frequency of Social Gatherings with Friends and Family:   . Attends Religious Services:   . Active Member of Clubs or Organizations:   . Attends Banker Meetings:   Marland Kitchen Marital Status:      Family History: The patient's family history includes Diabetes in his father and mother; Heart disease in his brother and father; High Cholesterol in his father; Hypertension in his father and mother.  ROS:   Please see the history of present illness.    All other systems reviewed and are negative.  EKGs/Labs/Other Studies Reviewed:    The following studies were reviewed today: Coronary artery disease.  CT scan report was discussed with the patient at length.  CT FFR ANALYSIS  CLINICAL DATA:  Chest pain  FINDINGS: FFRct analysis was performed on the original cardiac CT angiogram dataset. Diagrammatic representation of the FFRct analysis is provided in a separate PDF document in PACS. This dictation was created using the PDF document and an interactive 3D model of the results. 3D model is not available in the EMR/PACS. Normal FFR range is >0.80.  1. Left Main: 0.99; no significant stenosis.  2. Proximal LAD: 0.96; no significant stenosis. 3. Mid LAD: 0.88; no significant stenosis. 4. LCX: 0.94; no significant stenosis. 5. RCA: 0.91; no significant stenosis.  IMPRESSION: 1.  CT FFR analysis didn't show any significant stenosis.  Brian Odor, MD   Electronically Signed   By: Brian Salas   On: 01/14/2020 17:56   Recent Labs: 01/09/2020: BUN 14; Creatinine, Ser 1.15; Potassium 4.1; Sodium 140  Recent Lipid Panel No results found for: CHOL, TRIG, HDL, CHOLHDL, VLDL, LDLCALC,  LDLDIRECT  Physical Exam:    VS:  BP 138/76   Pulse 82   Ht 6' (1.829 m)   Wt 204 lb (92.5 kg)   SpO2 90%   BMI 27.67 kg/m     Wt Readings from Last 3 Encounters:  01/16/20 204 lb (92.5 kg)  01/09/20 203 lb (92.1 kg)     GEN: Patient is in no acute distress HEENT: Normal NECK: No JVD; No carotid bruits LYMPHATICS: No lymphadenopathy CARDIAC: Hear sounds regular, 2/6 systolic murmur at the apex. RESPIRATORY:  Clear to auscultation without rales, wheezing or rhonchi  ABDOMEN: Soft, non-tender, non-distended MUSCULOSKELETAL:  No edema; No deformity  SKIN: Warm and dry NEUROLOGIC:  Alert and oriented x 3 PSYCHIATRIC:  Normal affect   Signed, [name], MD  01/16/2020 4:08 PM  [facility]

## 2020-01-16 NOTE — Patient Instructions (Signed)

## 2020-01-22 LAB — HEPATIC FUNCTION PANEL
ALT: 32 IU/L (ref 0–44)
AST: 24 IU/L (ref 0–40)
Albumin: 4.9 g/dL (ref 3.8–4.9)
Alkaline Phosphatase: 97 IU/L (ref 48–121)
Bilirubin Total: 0.5 mg/dL (ref 0.0–1.2)
Bilirubin, Direct: 0.13 mg/dL (ref 0.00–0.40)
Total Protein: 6.8 g/dL (ref 6.0–8.5)

## 2020-01-22 LAB — LIPID PANEL
Chol/HDL Ratio: 3.1 ratio (ref 0.0–5.0)
Cholesterol, Total: 156 mg/dL (ref 100–199)
HDL: 51 mg/dL (ref >39)
LDL Chol Calc (NIH): 85 mg/dL (ref 0–99)
Triglycerides: 110 mg/dL (ref 0–149)
VLDL Cholesterol Cal: 20 mg/dL (ref 5–40)

## 2020-01-22 LAB — BASIC METABOLIC PANEL
BUN/Creatinine Ratio: 13 (ref 9–20)
BUN: 14 mg/dL (ref 6–24)
CO2: 20 mmol/L (ref 20–29)
Calcium: 10.1 mg/dL (ref 8.7–10.2)
Chloride: 101 mmol/L (ref 96–106)
Creatinine, Ser: 1.06 mg/dL (ref 0.76–1.27)
GFR calc Af Amer: 88 mL/min/{1.73_m2} (ref >59)
GFR calc non Af Amer: 76 mL/min/{1.73_m2} (ref >59)
Glucose: 127 mg/dL — ABNORMAL HIGH (ref 65–99)
Potassium: 4.3 mmol/L (ref 3.5–5.2)
Sodium: 139 mmol/L (ref 134–144)

## 2020-01-22 LAB — TSH: TSH: 1.53 u[IU]/mL (ref 0.450–4.500)

## 2020-01-24 ENCOUNTER — Ambulatory Visit (INDEPENDENT_AMBULATORY_CARE_PROVIDER_SITE_OTHER): Payer: Commercial Managed Care - PPO

## 2020-01-24 ENCOUNTER — Other Ambulatory Visit: Payer: Self-pay

## 2020-01-24 DIAGNOSIS — I472 Ventricular tachycardia: Secondary | ICD-10-CM

## 2020-01-24 NOTE — Progress Notes (Signed)
Complete echocardiogram performed.  Jimmy Clarnce Homan RDCS, RVT  

## 2020-09-22 ENCOUNTER — Telehealth: Payer: Self-pay | Admitting: Cardiology

## 2020-09-22 MED ORDER — METOPROLOL SUCCINATE ER 50 MG PO TB24: 50.0000 mg | ORAL_TABLET | Freq: Every day | ORAL | 0 refills | Status: DC

## 2020-09-22 NOTE — Telephone Encounter (Signed)
*  STAT* If patient is at the pharmacy, call can be transferred to refill team.   1. Which medications need to be refilled? (please list name of each medication and dose if known) metoprolol succinate (TOPROL-XL) 50 MG 24 hr tablet  2. Which pharmacy/location (including street and city if local pharmacy) is medication to be sent to?  [facility] - [place], [place] - [address]  3. Do they need a 30 day or 90 day supply? 90  

## 2020-09-22 NOTE — Telephone Encounter (Signed)
Refill of Metorpolol succinate 50 mg to [facility].

## 2020-10-06 DIAGNOSIS — I10 Essential (primary) hypertension: Secondary | ICD-10-CM | POA: Insufficient documentation

## 2020-10-06 DIAGNOSIS — E119 Type 2 diabetes mellitus without complications: Secondary | ICD-10-CM | POA: Insufficient documentation

## 2020-10-06 DIAGNOSIS — E785 Hyperlipidemia, unspecified: Secondary | ICD-10-CM | POA: Insufficient documentation

## 2020-10-06 DIAGNOSIS — E7800 Pure hypercholesterolemia, unspecified: Secondary | ICD-10-CM | POA: Insufficient documentation

## 2020-10-07 ENCOUNTER — Encounter: Payer: Self-pay | Admitting: Cardiology

## 2020-10-07 ENCOUNTER — Other Ambulatory Visit: Payer: Self-pay

## 2020-10-07 ENCOUNTER — Ambulatory Visit (INDEPENDENT_AMBULATORY_CARE_PROVIDER_SITE_OTHER): Payer: Managed Care, Other (non HMO) | Admitting: Cardiology

## 2020-10-07 VITALS — BP 130/70 | HR 80 | Ht 72.0 in | Wt 213.0 lb

## 2020-10-07 DIAGNOSIS — I472 Ventricular tachycardia: Secondary | ICD-10-CM

## 2020-10-07 DIAGNOSIS — Z87891 Personal history of nicotine dependence: Secondary | ICD-10-CM

## 2020-10-07 DIAGNOSIS — I2510 Atherosclerotic heart disease of native coronary artery without angina pectoris: Secondary | ICD-10-CM | POA: Diagnosis not present

## 2020-10-07 DIAGNOSIS — E088 Diabetes mellitus due to underlying condition with unspecified complications: Secondary | ICD-10-CM

## 2020-10-07 DIAGNOSIS — E782 Mixed hyperlipidemia: Secondary | ICD-10-CM | POA: Diagnosis not present

## 2020-10-07 DIAGNOSIS — I10 Essential (primary) hypertension: Secondary | ICD-10-CM | POA: Diagnosis not present

## 2020-10-07 DIAGNOSIS — I4729 Other ventricular tachycardia: Secondary | ICD-10-CM

## 2020-10-07 HISTORY — DX: Personal history of nicotine dependence: Z87.891

## 2020-10-07 NOTE — Progress Notes (Signed)
Cardiology Office Note:    Date:  10/07/2020   ID:  Brian Salas, DOB 1960/09/14, MRN [id number]  PCP:  [name], NP  Cardiologist:  [name], MD   Referring MD: [name], NP    ASSESSMENT:    1. Coronary artery disease involving native coronary artery of native heart without angina pectoris   2. Nonsustained ventricular tachycardia ([facility])   3. Primary hypertension   4. Mixed dyslipidemia   5. Diabetes mellitus due to underlying condition with unspecified complications ([facility])   6. Essential hypertension   7. Ex-smoker    PLAN:    In order of problems listed above:  1. Coronary artery disease: Secondary prevention stressed with patient.  Importance of compliance with diet medication stressed any vocalized understanding.  He works at least 12-hour shifts was difficult for him to exercise.  He is generally tired after working 12 hours physically.  I encouraged him to do aerobic exercises when possible.  Sublingual nitroglycerin prescription was sent, its protocol and 911 protocol explained and the patient vocalized understanding questions were answered to the patient's satisfaction. 2. Essential hypertension: Blood pressure stable and diet was emphasized.  Lifestyle modification and salt intake issues were discussed. 3. Mixed dyslipidemia: Diet emphasized.  He is on statin therapy and blood work in the next few days for complete blood work including fasting lipids. 4. Diabetes mellitus: Diet was emphasized.  This is managed by his primary care provider. 5. Ex-smoker: Quit about a year ago and promises never to go back.   Medication Adjustments/Labs and Tests Ordered: Current medicines are reviewed at length with the patient today.  Concerns regarding medicines are outlined above.  Orders Placed This Encounter  Procedures  . Basic metabolic panel  . CBC with Differential/Platelet  . Hepatic function panel  . Lipid panel  . TSH   No orders of the defined types  were placed in this encounter.    No chief complaint on file.    History of Present Illness:    Brian Salas is a 59 y.o. male.  Patient has past medical history of coronary artery disease, nonsustained ventricular tachycardia, essential hypertension dyslipidemia and is an ex-smoker.  He denies any problems at this time and takes care of activities of daily living.  No chest pain orthopnea or PND.  At the time of my evaluation, the patient is alert awake oriented and in no distress.  Past Medical History:  Diagnosis Date  . CAD (coronary artery disease) 01/16/2020  . Cigarette smoker 01/09/2020  . Diabetes ([facility])   . Diabetes mellitus due to underlying condition with unspecified complications ([facility]) 01/09/2020  . Essential hypertension 01/09/2020  . High cholesterol   . Hyperlipidemia   . Hypertension   . Mixed dyslipidemia 01/09/2020  . Nonsustained ventricular tachycardia ([facility]) 01/09/2020    Past Surgical History:  Procedure Laterality Date  . COLONOSCOPY  2010    Current Medications: Current Meds  Medication Sig  . aspirin EC 81 MG tablet Take 81 mg by mouth daily.  Marland Kitchen atorvastatin (LIPITOR) 20 MG tablet Take 20 mg by mouth daily.  Marland Kitchen lisinopril-hydrochlorothiazide (ZESTORETIC) 20-12.5 MG tablet Take 1 tablet by mouth daily.  . metFORMIN (GLUCOPHAGE) 1000 MG tablet Take 1,000 mg by mouth daily.  . metoprolol succinate (TOPROL-XL) 50 MG 24 hr tablet Take 1 tablet (50 mg total) by mouth daily. Take with or immediately following a meal.     Allergies:   Morphine and related and Prednisone  Social History   Socioeconomic History  . Marital status: Married    Spouse name: Not on file  . Number of children: Not on file  . Years of education: Not on file  . Highest education level: Not on file  Occupational History  . Not on file  Tobacco Use  . Smoking status: Former Smoker    Types: Cigarettes  . Smokeless tobacco: Never Used  Substance and Sexual Activity  . Alcohol  use: Not on file  . Drug use: Not on file  . Sexual activity: Not on file  Other Topics Concern  . Not on file  Social History Narrative  . Not on file   Social Determinants of Health   Financial Resource Strain: Not on file  Food Insecurity: Not on file  Transportation Needs: Not on file  Physical Activity: Not on file  Stress: Not on file  Social Connections: Not on file     Family History: The patient's family history includes Diabetes in his father and mother; Heart disease in his brother and father; High Cholesterol in his father; Hypertension in his father and mother.  ROS:   Please see the history of present illness.    All other systems reviewed and are negative.  EKGs/Labs/Other Studies Reviewed:    The following studies were reviewed today: EXAM: CT FFR ANALYSIS  CLINICAL DATA:  Chest pain  FINDINGS: FFRct analysis was performed on the original cardiac CT angiogram dataset. Diagrammatic representation of the FFRct analysis is provided in a separate PDF document in PACS. This dictation was created using the PDF document and an interactive 3D model of the results. 3D model is not available in the EMR/PACS. Normal FFR range is >0.80.  1. Left Main: 0.99; no significant stenosis.  2. Proximal LAD: 0.96; no significant stenosis. 3. Mid LAD: 0.88; no significant stenosis. 4. LCX: 0.94; no significant stenosis. 5. RCA: 0.91; no significant stenosis.  IMPRESSION: 1.  CT FFR analysis didn't show any significant stenosis.  [name], MD   Electronically Signed   By: [name]   On: 01/14/2020 17:56

## 2020-10-07 NOTE — Patient Instructions (Signed)

## 2021-02-22 ENCOUNTER — Other Ambulatory Visit: Payer: Self-pay | Admitting: Cardiology

## 2021-02-24 NOTE — Telephone Encounter (Signed)
Metoprolol Succinate ER 50 mg tablets # 90 x 3 refills sent to [facility] - [place], [place] - [address]

## 2021-04-08 ENCOUNTER — Ambulatory Visit: Payer: Managed Care, Other (non HMO) | Admitting: Cardiology

## 2021-04-09 ENCOUNTER — Encounter: Payer: Self-pay | Admitting: Cardiology

## 2021-06-01 ENCOUNTER — Other Ambulatory Visit: Payer: Self-pay

## 2021-06-02 ENCOUNTER — Encounter: Payer: Self-pay | Admitting: Cardiology

## 2021-06-02 ENCOUNTER — Ambulatory Visit (INDEPENDENT_AMBULATORY_CARE_PROVIDER_SITE_OTHER): Payer: Managed Care, Other (non HMO) | Admitting: Cardiology

## 2021-06-02 ENCOUNTER — Other Ambulatory Visit: Payer: Self-pay

## 2021-06-02 VITALS — BP 122/72 | HR 87 | Ht 72.0 in | Wt 200.4 lb

## 2021-06-02 DIAGNOSIS — I4729 Other ventricular tachycardia: Secondary | ICD-10-CM | POA: Diagnosis not present

## 2021-06-02 DIAGNOSIS — E782 Mixed hyperlipidemia: Secondary | ICD-10-CM

## 2021-06-02 DIAGNOSIS — I10 Essential (primary) hypertension: Secondary | ICD-10-CM

## 2021-06-02 DIAGNOSIS — I2510 Atherosclerotic heart disease of native coronary artery without angina pectoris: Secondary | ICD-10-CM | POA: Diagnosis not present

## 2021-06-02 DIAGNOSIS — E088 Diabetes mellitus due to underlying condition with unspecified complications: Secondary | ICD-10-CM

## 2021-06-02 MED ORDER — NITROGLYCERIN 0.4 MG SL SUBL: 0.4000 mg | SUBLINGUAL_TABLET | SUBLINGUAL | 6 refills | Status: DC | PRN

## 2021-06-02 NOTE — Progress Notes (Signed)
Cardiology Office Note:    Date:  06/02/2021   ID:  Brian Salas, DOB June 30, 1961, MRN [id number]  PCP:  [name], NP  Cardiologist:  [name], MD   Referring MD: [name], NP    ASSESSMENT:    1. Nonsustained ventricular tachycardia   2. Coronary artery disease involving native coronary artery of native heart without angina pectoris   3. Essential hypertension   4. Mixed hyperlipidemia   5. Diabetes mellitus due to underlying condition with unspecified complications ([facility])    PLAN:    In order of problems listed above:  Coronary artery disease: Secondary prevention stressed with patient.  Importance of compliance with diet medication stressed and she vocalized understanding.  I told him to walk half an hour a day if possible in regular basis in the promises to try.  Sublingual nitroglycerin prescription was sent, its protocol and 911 protocol explained and the patient vocalized understanding questions were answered to the patient's satisfaction Essential hypertension: Blood pressure stable and diet was emphasized. Mixed dyslipidemia and diabetes mellitus: Diet emphasized.  Lifestyle modification urged.  Lipids followed by primary care.Patient will be seen in follow-up appointment in 6 months or earlier if the patient has any concerns    Medication Adjustments/Labs and Tests Ordered: Current medicines are reviewed at length with the patient today.  Concerns regarding medicines are outlined above.  Orders Placed This Encounter  Procedures   EKG 12-Lead   No orders of the defined types were placed in this encounter.    No chief complaint on file.    History of Present Illness:    Brian Salas is a 60 y.o. male.  Patient has past medical history of coronary artery disease, essential hypertension, dyslipidemia and diabetes mellitus.  He denies any problems at this time and takes care of activities of daily living.  No chest pain orthopnea or PND.  At the  time of my evaluation, the patient is alert awake oriented and in no distress.  He works 12-hour shifts and finds that it is difficult to exercise.  Past Medical History:  Diagnosis Date   CAD (coronary artery disease) 01/16/2020   Diabetes ([facility])    Diabetes mellitus due to underlying condition with unspecified complications ([facility]) 01/09/2020   Essential hypertension 01/09/2020   Ex-smoker 10/07/2020   High cholesterol    Hyperlipidemia    Hypertension    Mixed dyslipidemia 01/09/2020   Nonsustained ventricular tachycardia 01/09/2020    Past Surgical History:  Procedure Laterality Date   COLONOSCOPY  2010    Current Medications: Current Meds  Medication Sig   aspirin EC 81 MG tablet Take 81 mg by mouth daily.   atorvastatin (LIPITOR) 20 MG tablet Take 20 mg by mouth daily.   FARXIGA 10 MG TABS tablet Take 10 mg by mouth daily.   lisinopril-hydrochlorothiazide (ZESTORETIC) 20-12.5 MG tablet Take 1 tablet by mouth daily.   metFORMIN (GLUCOPHAGE) 1000 MG tablet Take 1,000 mg by mouth daily.   metoprolol succinate (TOPROL-XL) 50 MG 24 hr tablet Take 50 mg by mouth daily.   TRULICITY 1.5 MG/0.5ML SOPN Inject 0.5 mLs into the skin once a week.     Allergies:   Morphine and related and Prednisone   Social History   Socioeconomic History   Marital status: Married    Spouse name: Not on file   Number of children: Not on file   Years of education: Not on file   Highest education level: Not on file  Occupational History   Not on file  Tobacco Use   Smoking status: Former    Types: Cigarettes   Smokeless tobacco: Never  Substance and Sexual Activity   Alcohol use: Not on file   Drug use: Not on file   Sexual activity: Not on file  Other Topics Concern   Not on file  Social History Narrative   Not on file   Social Determinants of Health   Financial Resource Strain: Not on file  Food Insecurity: Not on file  Transportation Needs: Not on file  Physical Activity: Not on  file  Stress: Not on file  Social Connections: Not on file     Family History: The patient's family history includes Diabetes in his father and mother; Heart disease in his brother and father; High Cholesterol in his father; Hypertension in his father and mother.  ROS:   Please see the history of present illness.    All other systems reviewed and are negative.  EKGs/Labs/Other Studies Reviewed:    The following studies were reviewed today:  EKG reveals sinus rhythm first-degree AV block and nonspecific ST-T changes.   Narrative & Impression  EXAM: CT FFR ANALYSIS   CLINICAL DATA:  Chest pain   FINDINGS: FFRct analysis was performed on the original cardiac CT angiogram dataset. Diagrammatic representation of the FFRct analysis is provided in a separate PDF document in PACS. This dictation was created using the PDF document and an interactive 3D model of the results. 3D model is not available in the EMR/PACS. Normal FFR range is >0.80.   1. Left Main: 0.99; no significant stenosis.   2. Proximal LAD: 0.96; no significant stenosis. 3. Mid LAD: 0.88; no significant stenosis. 4. LCX: 0.94; no significant stenosis. 5. RCA: 0.91; no significant stenosis.   IMPRESSION: 1.  CT FFR analysis didn't show any significant stenosis.   Brian Odor, MD     Electronically Signed   By: Brian Salas   On: 01/14/2020 17:56     Recent Labs: No results found for requested labs within last 8760 hours.  Recent Lipid Panel    Component Value Date/Time   CHOL 156 01/22/2020 0817   TRIG 110 01/22/2020 0817   HDL 51 01/22/2020 0817   CHOLHDL 3.1 01/22/2020 0817   LDLCALC 85 01/22/2020 0817    Physical Exam:    VS:  BP 122/72   Pulse 87   Ht 6' (1.829 m)   Wt 200 lb 6.4 oz (90.9 kg)   BMI 27.18 kg/m     Wt Readings from Last 3 Encounters:  06/02/21 200 lb 6.4 oz (90.9 kg)  10/07/20 213 lb (96.6 kg)  01/16/20 204 lb (92.5 kg)     GEN: Patient is in no acute  distress HEENT: Normal NECK: No JVD; No carotid bruits LYMPHATICS: No lymphadenopathy CARDIAC: Hear sounds regular, 2/6 systolic murmur at the apex. RESPIRATORY:  Clear to auscultation without rales, wheezing or rhonchi  ABDOMEN: Soft, non-tender, non-distended MUSCULOSKELETAL:  No edema; No deformity  SKIN: Warm and dry NEUROLOGIC:  Alert and oriented x 3 PSYCHIATRIC:  Normal affect   Signed, [name], MD  06/02/2021 3:52 PM    [facility]

## 2021-06-02 NOTE — Addendum Note (Signed)
Addended by: Eleonore Chiquito on: 06/02/2021 04:38 PM   Modules accepted: Orders

## 2021-06-02 NOTE — Patient Instructions (Signed)

## 2021-06-15 LAB — COLOGUARD: COLOGUARD: NEGATIVE

## 2022-02-16 ENCOUNTER — Other Ambulatory Visit: Payer: Self-pay | Admitting: Cardiology

## 2022-02-16 NOTE — Telephone Encounter (Signed)
Rx refill sent to pharmacy. 

## 2022-02-25 ENCOUNTER — Other Ambulatory Visit: Payer: Self-pay

## 2022-02-28 ENCOUNTER — Ambulatory Visit: Payer: Managed Care, Other (non HMO) | Admitting: Cardiology

## 2022-02-28 ENCOUNTER — Encounter: Payer: Self-pay | Admitting: Cardiology

## 2022-02-28 VITALS — BP 106/64 | HR 92 | Ht 72.0 in | Wt 206.2 lb

## 2022-02-28 DIAGNOSIS — E782 Mixed hyperlipidemia: Secondary | ICD-10-CM | POA: Diagnosis not present

## 2022-02-28 DIAGNOSIS — I10 Essential (primary) hypertension: Secondary | ICD-10-CM

## 2022-02-28 DIAGNOSIS — I4729 Other ventricular tachycardia: Secondary | ICD-10-CM

## 2022-02-28 DIAGNOSIS — E088 Diabetes mellitus due to underlying condition with unspecified complications: Secondary | ICD-10-CM

## 2022-02-28 DIAGNOSIS — I2510 Atherosclerotic heart disease of native coronary artery without angina pectoris: Secondary | ICD-10-CM | POA: Diagnosis not present

## 2022-02-28 NOTE — Patient Instructions (Signed)
Medication Instructions:  Your physician recommends that you continue on your current medications as directed. Please refer to the Current Medication list given to you today.  *If you need a refill on your cardiac medications before your next appointment, please call your pharmacy*   Lab Work: Your physician recommends that you return for lab work in: Today for a Basic Metabolic Panel, Magnesium, CBC, TSH, Liver Function, Lipid Panel, Vit D Level and a Hemoglobin A1C  If you have labs (blood work) drawn today and your tests are completely normal, you will receive your results only by: MyChart Message (if you have MyChart) OR A paper copy in the mail If you have any lab test that is abnormal or we need to change your treatment, we will call you to review the results.   Testing/Procedures: NONE   Follow-Up: At [facility], you and your health needs are our priority.  As part of our continuing mission to provide you with exceptional heart care, we have created designated Provider Care Teams.  These Care Teams include your primary Cardiologist (physician) and Advanced Practice Providers (APPs -  Physician Assistants and Nurse Practitioners) who all work together to provide you with the care you need, when you need it.  We recommend signing up for the patient portal called "MyChart".  Sign up information is provided on this After Visit Summary.  MyChart is used to connect with patients for Virtual Visits (Telemedicine).  Patients are able to view lab/test results, encounter notes, upcoming appointments, etc.  Non-urgent messages can be sent to your provider as well.   To learn more about what you can do with MyChart, go to [email or web address].    Your next appointment:   9 month(s)  The format for your next appointment:   In Person  Provider:   Belva Crome, MD    Other Instructions Record BP's and return log to our office  Important Information About Sugar

## 2022-02-28 NOTE — Progress Notes (Signed)
Cardiology Office Note:    Date:  02/28/2022   ID:  Brian Salas, DOB 03-26-1961, MRN [id number]  PCP:  [name], NP  Cardiologist:  [name], MD   Referring MD: [name], NP    ASSESSMENT:    1. Coronary artery disease involving native coronary artery of native heart without angina pectoris   2. Essential hypertension   3. Mixed hyperlipidemia   4. Nonsustained ventricular tachycardia (HCC)    PLAN:    In order of problems listed above:  Coronary artery disease: Secondary prevention stressed with the patient.  Importance of compliance with diet and medication stressed and he vocalized understanding.  He was advised to walk at least half an hour a day 5 days a week and he promises to do so. Essential hypertension: Blood pressure stable and diet was emphasized.  He was advised to keep a track of his blood pressures and if they are borderline then I will cut back his diuretic.  He will send [place] the log in a week. Mixed dyslipidemia: He is fasting and will have lipid work today and we will advise him accordingly. Diabetes mellitus and importance of exercise and diet stressed and he promises to comply and do better.  He requests vitamin D blood work also we will do complete blood work with him today and also do hemoglobin A1c and sent to primary care. Patient will be seen in follow-up appointment in 6 months or earlier if the patient has any concerns    Medication Adjustments/Labs and Tests Ordered: Current medicines are reviewed at length with the patient today.  Concerns regarding medicines are outlined above.  No orders of the defined types were placed in this encounter.  No orders of the defined types were placed in this encounter.    No chief complaint on file.    History of Present Illness:    Brian Salas is a 61 y.o. male.  Patient has past medical history of coronary artery disease, essential hypertension, dyslipidemia and diabetes mellitus.  He  denies any problems at this time and takes care of activities of daily living.  No chest pain orthopnea or PND.  He tells me that he is does not smoke anymore.  At the time of my evaluation, the patient is alert awake oriented and in no distress.  Past Medical History:  Diagnosis Date   CAD (coronary artery disease) 01/16/2020   Diabetes (HCC)    Diabetes mellitus due to underlying condition with unspecified complications (HCC) 01/09/2020   Essential hypertension 01/09/2020   Ex-smoker 10/07/2020   High cholesterol    Hyperlipidemia    Hypertension    Mixed dyslipidemia 01/09/2020   Nonsustained ventricular tachycardia (HCC) 01/09/2020    Past Surgical History:  Procedure Laterality Date   COLONOSCOPY  2010    Current Medications: Current Meds  Medication Sig   aspirin EC 81 MG tablet Take 81 mg by mouth daily.   atorvastatin (LIPITOR) 20 MG tablet Take 20 mg by mouth daily.   FARXIGA 10 MG TABS tablet Take 10 mg by mouth daily.   lisinopril-hydrochlorothiazide (ZESTORETIC) 20-12.5 MG tablet Take 1 tablet by mouth daily.   metFORMIN (GLUCOPHAGE) 1000 MG tablet Take 1,000 mg by mouth daily.   metoprolol succinate (TOPROL-XL) 50 MG 24 hr tablet TAKE 1 TABLET DAILY WITH OR IMMEDIATELY FOLLOWING A MEAL   nitroGLYCERIN (NITROSTAT) 0.4 MG SL tablet Place 0.4 mg under the tongue every 5 (five) minutes as needed for chest  pain.   TRULICITY 1.5 MG/0.5ML SOPN Inject 0.5 mLs into the skin once a week.     Allergies:   Morphine and related and Prednisone   Social History   Socioeconomic History   Marital status: Married    Spouse name: Not on file   Number of children: Not on file   Years of education: Not on file   Highest education level: Not on file  Occupational History   Not on file  Tobacco Use   Smoking status: Former    Types: Cigarettes   Smokeless tobacco: Never  Substance and Sexual Activity   Alcohol use: Not on file   Drug use: Not on file   Sexual activity: Not  on file  Other Topics Concern   Not on file  Social History Narrative   Not on file   Social Determinants of Health   Financial Resource Strain: Not on file  Food Insecurity: Not on file  Transportation Needs: Not on file  Physical Activity: Not on file  Stress: Not on file  Social Connections: Not on file     Family History: The patient's family history includes Diabetes in his father and mother; Heart disease in his brother and father; High Cholesterol in his father; Hypertension in his father and mother.  ROS:   Please see the history of present illness.    All other systems reviewed and are negative.  EKGs/Labs/Other Studies Reviewed:    The following studies were reviewed today: EKG reveals sinus rhythm and nonspecific ST-T changes   Recent Labs: No results found for requested labs within last 365 days.  Recent Lipid Panel    Component Value Date/Time   CHOL 156 01/22/2020 0817   TRIG 110 01/22/2020 0817   HDL 51 01/22/2020 0817   CHOLHDL 3.1 01/22/2020 0817   LDLCALC 85 01/22/2020 0817    Physical Exam:    VS:  BP 106/64   Pulse 92   Ht 6' (1.829 m)   Wt 206 lb 3.2 oz (93.5 kg)   SpO2 94%   BMI 27.97 kg/m     Wt Readings from Last 3 Encounters:  02/28/22 206 lb 3.2 oz (93.5 kg)  06/02/21 200 lb 6.4 oz (90.9 kg)  10/07/20 213 lb (96.6 kg)     GEN: Patient is in no acute distress HEENT: Normal NECK: No JVD; No carotid bruits LYMPHATICS: No lymphadenopathy CARDIAC: Hear sounds regular, 2/6 systolic murmur at the apex. RESPIRATORY:  Clear to auscultation without rales, wheezing or rhonchi  ABDOMEN: Soft, non-tender, non-distended MUSCULOSKELETAL:  No edema; No deformity  SKIN: Warm and dry NEUROLOGIC:  Alert and oriented x 3 PSYCHIATRIC:  Normal affect   Signed, [name], MD  02/28/2022 8:33 AM    [facility]

## 2022-03-01 LAB — HEPATIC FUNCTION PANEL
ALT: 35 IU/L (ref 0–44)
AST: 24 IU/L (ref 0–40)
Albumin: 4.7 g/dL (ref 3.9–4.9)
Alkaline Phosphatase: 113 IU/L (ref 44–121)
Bilirubin Total: 0.3 mg/dL (ref 0.0–1.2)
Bilirubin, Direct: <0.1 mg/dL (ref 0.00–0.40)
Total Protein: 6.9 g/dL (ref 6.0–8.5)

## 2022-03-01 LAB — BASIC METABOLIC PANEL
BUN/Creatinine Ratio: 11 (ref 10–24)
BUN: 12 mg/dL (ref 8–27)
CO2: 21 mmol/L (ref 20–29)
Calcium: 9.8 mg/dL (ref 8.6–10.2)
Chloride: 101 mmol/L (ref 96–106)
Creatinine, Ser: 1.08 mg/dL (ref 0.76–1.27)
Glucose: 125 mg/dL — ABNORMAL HIGH (ref 70–99)
Potassium: 4 mmol/L (ref 3.5–5.2)
Sodium: 140 mmol/L (ref 134–144)
eGFR: 78 mL/min/{1.73_m2} (ref >59)

## 2022-03-01 LAB — CBC WITH DIFFERENTIAL/PLATELET
Basophils Absolute: 0 10*3/uL (ref 0.0–0.2)
Basos: 0 %
EOS (ABSOLUTE): 0.1 10*3/uL (ref 0.0–0.4)
Eos: 1 %
Hematocrit: 44.5 % (ref 37.5–51.0)
Hemoglobin: 15.8 g/dL (ref 13.0–17.7)
Immature Grans (Abs): 0 10*3/uL (ref 0.0–0.1)
Immature Granulocytes: 0 %
Lymphocytes Absolute: 2 10*3/uL (ref 0.7–3.1)
Lymphs: 30 %
MCH: 33.9 pg — ABNORMAL HIGH (ref 26.6–33.0)
MCHC: 35.5 g/dL (ref 31.5–35.7)
MCV: 96 fL (ref 79–97)
Monocytes Absolute: 0.4 10*3/uL (ref 0.1–0.9)
Monocytes: 6 %
Neutrophils Absolute: 4.1 10*3/uL (ref 1.4–7.0)
Neutrophils: 63 %
Platelets: 168 10*3/uL (ref 150–450)
RBC: 4.66 x10E6/uL (ref 4.14–5.80)
RDW: 12.5 % (ref 11.6–15.4)
WBC: 6.7 10*3/uL (ref 3.4–10.8)

## 2022-03-01 LAB — HEMOGLOBIN A1C
Est. average glucose Bld gHb Est-mCnc: 157 mg/dL
Hgb A1c MFr Bld: 7.1 % — ABNORMAL HIGH (ref 4.8–5.6)

## 2022-03-01 LAB — LIPID PANEL
Chol/HDL Ratio: 3.8 ratio (ref 0.0–5.0)
Cholesterol, Total: 157 mg/dL (ref 100–199)
HDL: 41 mg/dL (ref >39)
LDL Chol Calc (NIH): 75 mg/dL (ref 0–99)
Triglycerides: 248 mg/dL — ABNORMAL HIGH (ref 0–149)
VLDL Cholesterol Cal: 41 mg/dL — ABNORMAL HIGH (ref 5–40)

## 2022-03-01 LAB — MAGNESIUM: Magnesium: 1.9 mg/dL (ref 1.6–2.3)

## 2022-03-01 LAB — TSH: TSH: 0.744 u[IU]/mL (ref 0.450–4.500)

## 2022-03-01 LAB — VITAMIN D 25 HYDROXY (VIT D DEFICIENCY, FRACTURES): Vit D, 25-Hydroxy: 23.6 ng/mL — ABNORMAL LOW (ref 30.0–100.0)

## 2022-05-17 ENCOUNTER — Other Ambulatory Visit: Payer: Self-pay | Admitting: Cardiology

## 2022-10-31 ENCOUNTER — Telehealth: Payer: Self-pay | Admitting: Cardiology

## 2022-10-31 NOTE — Telephone Encounter (Signed)
[facility] fax number [phone]

## 2022-11-01 ENCOUNTER — Other Ambulatory Visit: Payer: Self-pay

## 2022-11-01 MED ORDER — METOPROLOL SUCCINATE ER 50 MG PO TB24: ORAL_TABLET | ORAL | 0 refills | Status: DC

## 2022-11-29 ENCOUNTER — Other Ambulatory Visit: Payer: Self-pay

## 2022-12-05 ENCOUNTER — Encounter: Payer: Self-pay | Admitting: Cardiology

## 2022-12-05 ENCOUNTER — Ambulatory Visit: Payer: BC Managed Care – PPO | Attending: Cardiology | Admitting: Cardiology

## 2022-12-05 VITALS — BP 120/80 | HR 81 | Ht 72.0 in | Wt 202.2 lb

## 2022-12-05 DIAGNOSIS — I2510 Atherosclerotic heart disease of native coronary artery without angina pectoris: Secondary | ICD-10-CM | POA: Diagnosis not present

## 2022-12-05 DIAGNOSIS — I10 Essential (primary) hypertension: Secondary | ICD-10-CM

## 2022-12-05 DIAGNOSIS — I4729 Other ventricular tachycardia: Secondary | ICD-10-CM

## 2022-12-05 DIAGNOSIS — E782 Mixed hyperlipidemia: Secondary | ICD-10-CM

## 2022-12-05 LAB — LIPID PANEL
A1c: 7
EGFR (Non-African Amer.): 68.3
EGFR: 68

## 2022-12-05 NOTE — Patient Instructions (Signed)
Medication Instructions:  Your physician recommends that you continue on your current medications as directed. Please refer to the Current Medication list given to you today.  *If you need a refill on your cardiac medications before your next appointment, please call your pharmacy*   Lab Work: Your physician recommends that you return for lab work in: 2 months You need to have labs done when you are fasting.  You can come Monday through Friday 8:30 am to 12:00 pm and 1:15 to 4:30. You do not need to make an appointment as the order has already been placed. The labs you are going to have done are CMP and Lipids.  If you have labs (blood work) drawn today and your tests are completely normal, you will receive your results only by: MyChart Message (if you have MyChart) OR A paper copy in the mail If you have any lab test that is abnormal or we need to change your treatment, we will call you to review the results.   Testing/Procedures: None ordered   Follow-Up: At [facility], you and your health needs are our priority.  As part of our continuing mission to provide you with exceptional heart care, we have created designated Provider Care Teams.  These Care Teams include your primary Cardiologist (physician) and Advanced Practice Providers (APPs -  Physician Assistants and Nurse Practitioners) who all work together to provide you with the care you need, when you need it.  We recommend signing up for the patient portal called "MyChart".  Sign up information is provided on this After Visit Summary.  MyChart is used to connect with patients for Virtual Visits (Telemedicine).  Patients are able to view lab/test results, encounter notes, upcoming appointments, etc.  Non-urgent messages can be sent to your provider as well.   To learn more about what you can do with MyChart, go to [email or web address].    Your next appointment:   9 month(s)  The format for your next appointment:   In  Person  Provider:   Belva Crome, MD    Other Instructions none  Important Information About Sugar

## 2022-12-05 NOTE — Progress Notes (Signed)
Cardiology Office Note:    Date:  12/05/2022   ID:  Brian Salas, DOB 1960/12/22, MRN [id number]  PCP:  Erskine Emery, NP  Cardiologist:  [name], MD   Referring MD: Erskine Emery, NP    ASSESSMENT:    1. Coronary artery disease involving native coronary artery of native heart without angina pectoris   2. Essential hypertension   3. Nonsustained ventricular tachycardia   4. Mixed dyslipidemia    PLAN:    In order of problems listed above:  Coronary artery disease: Secondary prevention stressed with the patient.  Importance of compliance with diet medication stressed any vocalized understanding.  He was advised to walk at least half an hour a day 5 days a week and he promises to do so. Essential hypertension: Blood pressure stable and diet was emphasized. Mixed dyslipidemia: On lipid-lowering medications followed by primary care.  Lipids were reviewed they are elevated.  Diet emphasized.  I told him we will recheck this in 2 months he is agreeable. Diabetes mellitus: Managed by primary care.  Diet was emphasized.  Abdominal obesity issues were discussed and the risks explained Patient will be seen in follow-up appointment in 9 months or earlier if the patient has any concerns.    Medication Adjustments/Labs and Tests Ordered: Current medicines are reviewed at length with the patient today.  Concerns regarding medicines are outlined above.  No orders of the defined types were placed in this encounter.  No orders of the defined types were placed in this encounter.    No chief complaint on file.    History of Present Illness:    Brian Salas is a 62 y.o. male.  Patient has past medical history of coronary artery disease, essential hypertension, mixed dyslipidemia and diabetes mellitus.  He denies any problems at this time and takes care of activities of daily living.  No chest pain orthopnea or PND.  He leads a sedentary lifestyle.  He is active and works a  full-time job but does not exercise.  At the time of my evaluation, the patient is alert awake oriented and in no distress.  Past Medical History:  Diagnosis Date   CAD (coronary artery disease) 01/16/2020   Diabetes    Diabetes mellitus due to underlying condition with unspecified complications 01/09/2020   Essential hypertension 01/09/2020   Ex-smoker 10/07/2020   High cholesterol    Hyperlipidemia    Hypertension    Mixed dyslipidemia 01/09/2020   Nonsustained ventricular tachycardia 01/09/2020    Past Surgical History:  Procedure Laterality Date   COLONOSCOPY  2010    Current Medications: Current Meds  Medication Sig   aspirin EC 81 MG tablet Take 81 mg by mouth daily.   atorvastatin (LIPITOR) 20 MG tablet Take 20 mg by mouth daily.   FARXIGA 10 MG TABS tablet Take 10 mg by mouth daily.   lisinopril-hydrochlorothiazide (ZESTORETIC) 20-12.5 MG tablet Take 1 tablet by mouth daily.   metFORMIN (GLUCOPHAGE) 1000 MG tablet Take 1,000 mg by mouth daily.   metoprolol succinate (TOPROL-XL) 50 MG 24 hr tablet TAKE 1 TABLET DAILY WITH OR IMMEDIATELY FOLLOWING A MEAL   nitroGLYCERIN (NITROSTAT) 0.4 MG SL tablet Place 0.4 mg under the tongue every 5 (five) minutes as needed for chest pain.   TRULICITY 3 MG/0.5ML SOPN Inject 3 mg into the skin once a week.   VITAMIN D3 1.25 MG (50000 UT) capsule Take 50,000 Units by mouth once a week.     Allergies:  Morphine and related and Prednisone   Social History   Socioeconomic History   Marital status: Married    Spouse name: Not on file   Number of children: Not on file   Years of education: Not on file   Highest education level: Not on file  Occupational History   Not on file  Tobacco Use   Smoking status: Former    Types: Cigarettes   Smokeless tobacco: Never  Substance and Sexual Activity   Alcohol use: Not on file   Drug use: Not on file   Sexual activity: Not on file  Other Topics Concern   Not on file  Social History  Narrative   Not on file   Social Determinants of Health   Financial Resource Strain: Not on file  Food Insecurity: Not on file  Transportation Needs: Not on file  Physical Activity: Not on file  Stress: Not on file  Social Connections: Not on file     Family History: The patient's family history includes Diabetes in his father and mother; Heart disease in his brother and father; High Cholesterol in his father; Hypertension in his father and mother.  ROS:   Please see the history of present illness.    All other systems reviewed and are negative.  EKGs/Labs/Other Studies Reviewed:    The following studies were reviewed today: EKG reveals sinus rhythm and nonspecific ST-T changes   Recent Labs: 02/28/2022: ALT 35; BUN 12; Creatinine, Ser 1.08; Hemoglobin 15.8; Magnesium 1.9; Platelets 168; Potassium 4.0; Sodium 140; TSH 0.744  Recent Lipid Panel    Component Value Date/Time   CHOL 157 02/28/2022 0904   TRIG 248 (H) 02/28/2022 0904   HDL 41 02/28/2022 0904   CHOLHDL 3.8 02/28/2022 0904   LDLCALC 75 02/28/2022 0904    Physical Exam:    VS:  BP 120/80   Pulse 81   Ht 6' (1.829 m)   Wt 202 lb 3.2 oz (91.7 kg)   SpO2 94%   BMI 27.42 kg/m     Wt Readings from Last 3 Encounters:  12/05/22 202 lb 3.2 oz (91.7 kg)  02/28/22 206 lb 3.2 oz (93.5 kg)  06/02/21 200 lb 6.4 oz (90.9 kg)     GEN: Patient is in no acute distress HEENT: Normal NECK: No JVD; No carotid bruits LYMPHATICS: No lymphadenopathy CARDIAC: Hear sounds regular, 2/6 systolic murmur at the apex. RESPIRATORY:  Clear to auscultation without rales, wheezing or rhonchi  ABDOMEN: Soft, non-tender, non-distended MUSCULOSKELETAL:  No edema; No deformity  SKIN: Warm and dry NEUROLOGIC:  Alert and oriented x 3 PSYCHIATRIC:  Normal affect   Signed, [name], MD  12/05/2022 2:13 PM    [facility]

## 2022-12-05 NOTE — Addendum Note (Signed)
Addended by: Eleonore Chiquito on: 12/05/2022 02:18 PM   Modules accepted: Orders

## 2023-01-02 ENCOUNTER — Ambulatory Visit: Payer: Managed Care, Other (non HMO) | Admitting: Cardiology

## 2023-01-30 ENCOUNTER — Telehealth: Payer: Self-pay | Admitting: Cardiology

## 2023-01-30 ENCOUNTER — Other Ambulatory Visit: Payer: Self-pay

## 2023-01-30 MED ORDER — METOPROLOL SUCCINATE ER 50 MG PO TB24: ORAL_TABLET | ORAL | 3 refills | Status: DC

## 2023-01-30 NOTE — Telephone Encounter (Signed)
Called patient and left a detailed message explaining that his Metoprolol succinate medication had been re-filled. Instructed the patient to call back if he had any questions.

## 2023-01-30 NOTE — Telephone Encounter (Signed)
 *  STAT* If patient is at the pharmacy, call can be transferred to refill team.   1. Which medications need to be refilled? (please list name of each medication and dose if known)   metoprolol succinate (TOPROL-XL) 50 MG 24 hr tablet    2. Which pharmacy/location (including street and city if local pharmacy) is [facility]  [address], [place], [place] [address] Phone: [phone]  3. Do they need a 30 day or 90 day supply? 90 days

## 2023-08-30 LAB — LAB REPORT - SCANNED
A1c: 6.9
EGFR: 59

## 2023-09-04 ENCOUNTER — Other Ambulatory Visit: Payer: Self-pay

## 2023-09-05 ENCOUNTER — Ambulatory Visit: Payer: 59 | Attending: Cardiology | Admitting: Cardiology

## 2023-09-05 ENCOUNTER — Encounter: Payer: Self-pay | Admitting: Cardiology

## 2023-09-05 VITALS — BP 104/68 | HR 78 | Ht 72.0 in | Wt 196.4 lb

## 2023-09-05 DIAGNOSIS — I4729 Other ventricular tachycardia: Secondary | ICD-10-CM | POA: Diagnosis not present

## 2023-09-05 DIAGNOSIS — E782 Mixed hyperlipidemia: Secondary | ICD-10-CM

## 2023-09-05 DIAGNOSIS — I2510 Atherosclerotic heart disease of native coronary artery without angina pectoris: Secondary | ICD-10-CM | POA: Diagnosis not present

## 2023-09-05 DIAGNOSIS — I10 Essential (primary) hypertension: Secondary | ICD-10-CM | POA: Diagnosis not present

## 2023-09-05 NOTE — Patient Instructions (Signed)

## 2023-09-05 NOTE — Progress Notes (Signed)
 Cardiology Office Note:    Date:  09/05/2023   ID:  Brian Salas, DOB 30-Apr-1961, MRN [id number]  PCP:  Silvano Angeline FALCON, NP  Cardiologist:  Jennifer JONELLE Crape, MD   Referring MD: Silvano Angeline FALCON, NP    ASSESSMENT:    1. Coronary artery disease involving native coronary artery of native heart without angina pectoris   2. Essential hypertension   3. Nonsustained ventricular tachycardia (HCC)   4. Mixed dyslipidemia    PLAN:    In order of problems listed above:  Coronary artery disease: Secondary prevention stressed.  He is doing well with exercise.  I told him to walk at least half an hour a day on a daily basis.  He promises to do so. Essential hypertension: Stable blood pressure I discussed this with him.  Diet emphasized salt intake issues discussed. Mixed dyslipidemia: On lipid-lowering medications followed by primary care.  He says blood work was done recently.  Will get a copy of those records.  Goal LDL must be less than 60. Patient will be seen in follow-up appointment in 9 months or earlier if the patient has any concerns.    Medication Adjustments/Labs and Tests Ordered: Current medicines are reviewed at length with the patient today.  Concerns regarding medicines are outlined above.  No orders of the defined types were placed in this encounter.  No orders of the defined types were placed in this encounter.    No chief complaint on file.    History of Present Illness:    Brian Salas is a 63 y.o. male.  Patient has past medical history of coronary artery disease, essential hypertension, mixed dyslipidemia and diabetes mellitus.  He denies any problems at this time and takes care of activities of daily living.  He mentions to me that he walks extensively on a daily basis and with this he is asymptomatic.  At the time of my evaluation, the patient is alert awake oriented and in no distress.  Past Medical History:  Diagnosis Date   CAD (coronary artery disease)  01/16/2020   Diabetes (HCC)    Diabetes mellitus due to underlying condition with unspecified complications (HCC) 01/09/2020   Essential hypertension 01/09/2020   Ex-smoker 10/07/2020   High cholesterol    Hyperlipidemia    Hypertension    Mixed dyslipidemia 01/09/2020   Nonsustained ventricular tachycardia (HCC) 01/09/2020    Past Surgical History:  Procedure Laterality Date   COLONOSCOPY  2010    Current Medications: Current Meds  Medication Sig   aspirin EC 81 MG tablet Take 81 mg by mouth daily.   atorvastatin (LIPITOR) 20 MG tablet Take 20 mg by mouth daily.   FARXIGA 10 MG TABS tablet Take 10 mg by mouth daily.   lisinopril-hydrochlorothiazide (ZESTORETIC) 20-12.5 MG tablet Take 1 tablet by mouth daily.   metFORMIN (GLUCOPHAGE) 1000 MG tablet Take 1,000 mg by mouth daily.   metoprolol  succinate (TOPROL -XL) 50 MG 24 hr tablet Take 50 mg by mouth daily.   nitroGLYCERIN  (NITROSTAT ) 0.4 MG SL tablet Place 0.4 mg under the tongue every 5 (five) minutes as needed for chest pain.   OZEMPIC, 0.25 OR 0.5 MG/DOSE, 2 MG/3ML SOPN Inject 0.25 mg into the skin once a week.     Allergies:   Morphine and codeine and Prednisone   Social History   Socioeconomic History   Marital status: Married    Spouse name: Not on file   Number of children: Not on file   Years of  education: Not on file   Highest education level: Not on file  Occupational History   Not on file  Tobacco Use   Smoking status: Former    Types: Cigarettes   Smokeless tobacco: Never  Substance and Sexual Activity   Alcohol use: Not on file   Drug use: Not on file   Sexual activity: Not on file  Other Topics Concern   Not on file  Social History Narrative   Not on file   Social Drivers of Health   Financial Resource Strain: Not on file  Food Insecurity: Not on file  Transportation Needs: Not on file  Physical Activity: Not on file  Stress: Not on file  Social Connections: Unknown (01/04/2022)   Received  [facility]   Social Network    Social Network: Not on file     Family History: The patient's family history includes Diabetes in his father and mother; Heart disease in his brother and father; High Cholesterol in his father; Hypertension in his father and mother.  ROS:   Please see the history of present illness.    All other systems reviewed and are negative.  EKGs/Labs/Other Studies Reviewed:    The following studies were reviewed today: I discussed my findings with the patient at length   Recent Labs: No results found for requested labs within last 365 days.  Recent Lipid Panel    Component Value Date/Time   CHOL 157 02/28/2022 0904   TRIG 248 (H) 02/28/2022 0904   HDL 41 02/28/2022 0904   CHOLHDL 3.8 02/28/2022 0904   LDLCALC 75 02/28/2022 0904    Physical Exam:    VS:  BP 104/68   Pulse 78   Ht 6' (1.829 m)   Wt 196 lb 6.4 oz (89.1 kg)   SpO2 94%   BMI 26.64 kg/m     Wt Readings from Last 3 Encounters:  09/05/23 196 lb 6.4 oz (89.1 kg)  12/05/22 202 lb 3.2 oz (91.7 kg)  02/28/22 206 lb 3.2 oz (93.5 kg)     GEN: Patient is in no acute distress HEENT: Normal NECK: No JVD; No carotid bruits LYMPHATICS: No lymphadenopathy CARDIAC: Hear sounds regular, 2/6 systolic murmur at the apex. RESPIRATORY:  Clear to auscultation without rales, wheezing or rhonchi  ABDOMEN: Soft, non-tender, non-distended MUSCULOSKELETAL:  No edema; No deformity  SKIN: Warm and dry NEUROLOGIC:  Alert and oriented x 3 PSYCHIATRIC:  Normal affect   Signed, Jennifer JONELLE Crape, MD  09/05/2023 2:37 PM    [facility]

## 2023-12-05 ENCOUNTER — Other Ambulatory Visit: Payer: Self-pay | Admitting: Cardiology

## 2024-03-02 LAB — LAB REPORT - SCANNED
A1c: 6.7
Albumin, Urine POC: 3.2
Creatinine, POC: 106.92 mg/dL
EGFR: 48.3
Microalb Creat Ratio: 29.9

## 2024-05-06 ENCOUNTER — Other Ambulatory Visit: Payer: Self-pay

## 2024-05-10 ENCOUNTER — Telehealth: Payer: Self-pay | Admitting: Cardiology

## 2024-05-10 NOTE — Telephone Encounter (Signed)
 Patient's wife is calling because the patient had a scan on his lungs on 04/10/24. The scan results are in Care Everywhere, but the patient's PCP recommended that the patient be seen by cardiology based on results. Please advise.

## 2024-05-10 NOTE — Telephone Encounter (Signed)
 Appointment scheduled and requested report as we cannot see the most recent.

## 2024-05-22 ENCOUNTER — Encounter: Payer: Self-pay | Admitting: Cardiology

## 2024-05-22 ENCOUNTER — Ambulatory Visit: Attending: Cardiology | Admitting: Cardiology

## 2024-05-22 VITALS — BP 108/68 | HR 83 | Ht 72.0 in | Wt 191.0 lb

## 2024-05-22 DIAGNOSIS — E782 Mixed hyperlipidemia: Secondary | ICD-10-CM

## 2024-05-22 DIAGNOSIS — I10 Essential (primary) hypertension: Secondary | ICD-10-CM | POA: Diagnosis not present

## 2024-05-22 DIAGNOSIS — I2510 Atherosclerotic heart disease of native coronary artery without angina pectoris: Secondary | ICD-10-CM

## 2024-05-22 DIAGNOSIS — I4729 Other ventricular tachycardia: Secondary | ICD-10-CM

## 2024-05-22 MED ORDER — NITROGLYCERIN 0.4 MG SL SUBL: 0.4000 mg | SUBLINGUAL_TABLET | SUBLINGUAL | 11 refills | Status: AC | PRN

## 2024-05-22 NOTE — Patient Instructions (Signed)

## 2024-05-22 NOTE — Progress Notes (Signed)
 Cardiology Office Note:    Date:  05/22/2024   ID:  Brian Salas, DOB 26-Oct-1960, MRN [id number]  PCP:  Silvano Angeline FALCON, NP  Cardiologist:  Jennifer JONELLE Crape, MD   Referring MD: Silvano Angeline FALCON, NP    ASSESSMENT:    1. Coronary artery disease involving native coronary artery of native heart without angina pectoris   2. Essential hypertension   3. Nonsustained ventricular tachycardia (HCC)   4. Mixed dyslipidemia    PLAN:    In order of problems listed above:  Coronary artery disease: Secondary prevention stressed with the patient.  Importance of compliance with diet medication stressed any vocalized understanding.  He was advised to walk at least half an hour on a daily basis and he promises to do so. Essential hypertension: Blood pressure is stable and diet was emphasized. Mixed dyslipidemia: Lipid-lowering medications followed by primary care.  Lipids reviewed and discussed with the patient.  I reviewed blood work from primary care office. Diabetes mellitus: Managed by primary care.  Diet emphasized.  Last hemoglobin A1c appears fine on the KPN sheet.  I reviewed this with him. Patient will be seen in follow-up appointment in 6 months or earlier if the patient has any concerns.    Medication Adjustments/Labs and Tests Ordered: Current medicines are reviewed at length with the patient today.  Concerns regarding medicines are outlined above.  Orders Placed This Encounter  Procedures   EKG 12-Lead   Meds ordered this encounter  Medications   nitroGLYCERIN  (NITROSTAT ) 0.4 MG SL tablet    Sig: Place 1 tablet (0.4 mg total) under the tongue every 5 (five) minutes as needed for chest pain.    Dispense:  25 tablet    Refill:  11     No chief complaint on file.    History of Present Illness:    Brian Salas is a 63 y.o. male.  He has past medical history of coronary artery disease, essential hypertension, dyslipidemia and diabetes mellitus.  He denies any problems at this  time and takes care of activities of daily living.  No chest pain orthopnea or PND.  Time he tells me that he walks on a regular basis.  Past Medical History:  Diagnosis Date   CAD (coronary artery disease) 01/16/2020   Diabetes (HCC)    Essential hypertension 01/09/2020   Ex-smoker 10/07/2020   High cholesterol    Hyperlipidemia    Hypertension    Mixed dyslipidemia 01/09/2020   Nonsustained ventricular tachycardia (HCC) 01/09/2020    Past Surgical History:  Procedure Laterality Date   COLONOSCOPY  2010    Current Medications: Current Meds  Medication Sig   aspirin EC 81 MG tablet Take 81 mg by mouth daily.   atorvastatin (LIPITOR) 20 MG tablet Take 20 mg by mouth daily.   FARXIGA 10 MG TABS tablet Take 10 mg by mouth daily.   lisinopril-hydrochlorothiazide (ZESTORETIC) 20-12.5 MG tablet Take 1 tablet by mouth daily.   metFORMIN (GLUCOPHAGE) 1000 MG tablet Take 1,000 mg by mouth daily.   metoprolol  succinate (TOPROL -XL) 50 MG 24 hr tablet Take 1 tablet (50 mg total) by mouth daily.   OZEMPIC, 1 MG/DOSE, 4 MG/3ML SOPN Inject 1 mg into the skin once a week.   [DISCONTINUED] nitroGLYCERIN  (NITROSTAT ) 0.4 MG SL tablet Place 0.4 mg under the tongue every 5 (five) minutes as needed for chest pain.     Allergies:   Morphine and codeine and Prednisone   Social History   Socioeconomic History  Marital status: Married    Spouse name: Not on file   Number of children: Not on file   Years of education: Not on file   Highest education level: Not on file  Occupational History   Not on file  Tobacco Use   Smoking status: Former    Types: Cigarettes   Smokeless tobacco: Never  Substance and Sexual Activity   Alcohol use: Not on file   Drug use: Not on file   Sexual activity: Not on file  Other Topics Concern   Not on file  Social History Narrative   Not on file   Social Drivers of Health   Financial Resource Strain: Not on file  Food Insecurity: Not on file   Transportation Needs: Not on file  Physical Activity: Not on file  Stress: Not on file  Social Connections: Unknown (01/04/2022)   Received from [facility]   Social Network    Social Network: Not on file     Family History: The patient's family history includes Diabetes in his father and mother; Heart disease in his brother and father; High Cholesterol in his father; Hypertension in his father and mother.  ROS:   Please see the history of present illness.    All other systems reviewed and are negative.  EKGs/Labs/Other Studies Reviewed:    The following studies were reviewed today: I discussed my findings with the patient at length   Recent Labs: No results found for requested labs within last 365 days.  Recent Lipid Panel    Component Value Date/Time   CHOL 157 02/28/2022 0904   TRIG 248 (H) 02/28/2022 0904   HDL 41 02/28/2022 0904   CHOLHDL 3.8 02/28/2022 0904   LDLCALC 75 02/28/2022 0904    Physical Exam:    VS:  BP 108/68   Pulse 83   Ht 6' (1.829 m)   Wt 191 lb (86.6 kg)   SpO2 96%   BMI 25.90 kg/m     Wt Readings from Last 3 Encounters:  05/22/24 191 lb (86.6 kg)  09/05/23 196 lb 6.4 oz (89.1 kg)  12/05/22 202 lb 3.2 oz (91.7 kg)     GEN: Patient is in no acute distress HEENT: Normal NECK: No JVD; No carotid bruits LYMPHATICS: No lymphadenopathy CARDIAC: Hear sounds regular, 2/6 systolic murmur at the apex. RESPIRATORY:  Clear to auscultation without rales, wheezing or rhonchi  ABDOMEN: Soft, non-tender, non-distended MUSCULOSKELETAL:  No edema; No deformity  SKIN: Warm and dry NEUROLOGIC:  Alert and oriented x 3 PSYCHIATRIC:  Normal affect   Signed, Jennifer JONELLE Crape, MD  05/22/2024 9:14 AM    [facility]

## 2024-06-23 LAB — COLOGUARD: COLOGUARD: NEGATIVE

## 2024-07-04 ENCOUNTER — Other Ambulatory Visit: Payer: Self-pay

## 2024-09-21 ENCOUNTER — Other Ambulatory Visit: Payer: Self-pay | Admitting: Cardiology
